# Patient Record
Sex: Female | Born: 1975 | Race: Black or African American | Hispanic: No | Marital: Single | Smoking: Never smoker
Health system: Southern US, Community
[De-identification: ages and names within clinical notes are randomized; demographics above are authoritative.]

## PROBLEM LIST (undated history)

## (undated) DIAGNOSIS — R7989 Other specified abnormal findings of blood chemistry: Secondary | ICD-10-CM

## (undated) DIAGNOSIS — F259 Schizoaffective disorder, unspecified: Secondary | ICD-10-CM

## (undated) DIAGNOSIS — I10 Essential (primary) hypertension: Secondary | ICD-10-CM

## (undated) DIAGNOSIS — R002 Palpitations: Secondary | ICD-10-CM

## (undated) DIAGNOSIS — E663 Overweight: Secondary | ICD-10-CM

## (undated) DIAGNOSIS — D131 Benign neoplasm of stomach: Secondary | ICD-10-CM

## (undated) DIAGNOSIS — F419 Anxiety disorder, unspecified: Secondary | ICD-10-CM

## (undated) DIAGNOSIS — K602 Anal fissure, unspecified: Secondary | ICD-10-CM

## (undated) DIAGNOSIS — L739 Follicular disorder, unspecified: Secondary | ICD-10-CM

## (undated) DIAGNOSIS — K5900 Constipation, unspecified: Secondary | ICD-10-CM

## (undated) DIAGNOSIS — K219 Gastro-esophageal reflux disease without esophagitis: Secondary | ICD-10-CM

## (undated) DIAGNOSIS — R000 Tachycardia, unspecified: Secondary | ICD-10-CM

## (undated) DIAGNOSIS — R7303 Prediabetes: Secondary | ICD-10-CM

## (undated) DIAGNOSIS — K649 Unspecified hemorrhoids: Secondary | ICD-10-CM

## (undated) HISTORY — PX: BREAST LUMPECTOMY: SHX2

## (undated) HISTORY — DX: Constipation, unspecified: K59.00

## (undated) HISTORY — PX: WISDOM TOOTH EXTRACTION: SHX21

## (undated) HISTORY — DX: Prediabetes: R73.03

## (undated) HISTORY — DX: Gastro-esophageal reflux disease without esophagitis: K21.9

## (undated) HISTORY — DX: Overweight: E66.3

## (undated) HISTORY — DX: Other specified abnormal findings of blood chemistry: R79.89

## (undated) HISTORY — DX: Unspecified hemorrhoids: K64.9

## (undated) HISTORY — DX: Follicular disorder, unspecified: L73.9

## (undated) HISTORY — DX: Benign neoplasm of stomach: D13.1

## (undated) HISTORY — DX: Essential (primary) hypertension: I10

## (undated) HISTORY — DX: Palpitations: R00.2

## (undated) HISTORY — DX: Anal fissure, unspecified: K60.2

## (undated) HISTORY — DX: Tachycardia, unspecified: R00.0

## (undated) HISTORY — DX: Schizoaffective disorder, unspecified: F25.9

---

## 2011-03-30 HISTORY — PX: OTHER SURGICAL HISTORY: SHX169

## 2016-11-01 ENCOUNTER — Encounter: Payer: Self-pay | Admitting: Internal Medicine

## 2016-12-22 ENCOUNTER — Encounter: Payer: Self-pay | Admitting: Nurse Practitioner

## 2016-12-22 ENCOUNTER — Ambulatory Visit (INDEPENDENT_AMBULATORY_CARE_PROVIDER_SITE_OTHER): Payer: Medicaid Other | Admitting: Nurse Practitioner

## 2016-12-22 DIAGNOSIS — K5900 Constipation, unspecified: Secondary | ICD-10-CM

## 2016-12-22 DIAGNOSIS — K219 Gastro-esophageal reflux disease without esophagitis: Secondary | ICD-10-CM | POA: Diagnosis not present

## 2016-12-22 NOTE — Assessment & Plan Note (Signed)
The patient describes constipation with 1-2 bowel movements a week and straining. Sometimes hard stools, sometimes runny stools. Possible constipation with overflow diarrhea. At this point I will start with over-the-counter medications and have her try Colace 100 mg daily. She can use MiraLAX powder 1-2 times a day as needed for no bowel movement and 2 days. Return for follow-up in 6 weeks.

## 2016-12-22 NOTE — Assessment & Plan Note (Addendum)
Significantly worsening reflux in the past 2 months. Has tried pantoprazole which caused diarrhea. Omeprazole caused cramps. Has tried ranitidine without luck, tried Tums without success. At this point I'll trial her on Dexon 60 mg once a day. I will provide her with samples to last a couple weeks and request a progress report in 1-2 weeks. Lives in 6 weeks.  Given her history of benign stomach tumor and sudden worsening of symptoms, we may need to proceed with upper endoscopy pending her results with Dexilant. I feel it is prudent to try her on a successful PPI first. There is always the possibility of H. pylori.

## 2016-12-22 NOTE — Progress Notes (Addendum)
Primary Care Physician:  Tollie Pizza, MD Primary Gastroenterologist:  Dr. Gala Romney  Chief Complaint  Patient presents with  . Gastroesophageal Reflux    HPI:   Gabrielle Scott is a 41 y.o. female who presents On referral from primary care for long-standing GERD poorly controlled with over-the-counter medications. PCP notes reviewed, last saw primary care 10/22/2016 for GERD and other symptoms. Patient noted also have hemorrhoids, still having some bright red blood in her stools, does not feel overtly obstipated. Stated at that time that she felt reflux has been bad, worse at night, Tums and ranitidine did not help, Protonix gave her diarrhea. Noted chronic history of GERD, history of gastric antrum mass on upper endoscopy completed by [facility]. Anusol cream was sent to the pharmacy for hemorrhoids.  [facility] gastroenterology records reviewed. The patient had a colonoscopy 07/27/2011 with a few ulcers in the rectum, 2 benign-appearing sessile polyps status post removal. 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 EGD completed also 2007 found a 1.5 cm umbilicated nodule in the gastric antrum status post biopsy. Surgical pathology does not favor gastrointestinal stromal tumor differentials include hyperplastic polyp, adenomatous polyp, inflammatory myelofibrosis last tumor, inflammatory fibroid polyp, plexiform angiomyxoma related myofibrosis blastic tumor. Pathology was sent to urination [place] for outside expert opinion.  Results of specialist consultation include These results essentially exclude a number ofpossibilities including a gastrointestinal stromal tumor, a Schwann cell orneural proliferation, a perineuroma, and an inflammatory fibroid polyp.Theproliferation produces some collagen as highlighted on the trichrome stain Iperformed and the cells have some weak staining for muscle specific actin, suggesting that they could be myofibroblasts.I am  left without a specific namefor this process, although I think it is benign.I also shared this case withmy colleagues, Drs. Sylvester Harder and Joyce Gross, as well as Dr. 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 Appears a stomach polypectomy was performed and inked margin appears negative for lesional tissue.  The patient had a follow-up appointment 10/05/2016 at [facility] gastroenterology but she did not show for her appointment.  Today she states she's doing ok overall. She began having worsening reflux/GERD symptoms about 2 months ago. TUMS and Zantac no longer effective. Stopped taking Protonix due to diarrhea ADE. Not on any other medications for reflux other than TUMS and Zantac. Denies N/V. Has hematochezia about once a week, on the paper and in the toilet. Denies melena, fever, chills, unintentional weight loss. Has occasional constipation for years, not currently on any medications for constipation. Has a bowel movement 2-3 times a week, occasional straining. Denies chest pain, dyspnea, dizziness, lightheadedness, syncope, near syncope. Denies any other upper or lower GI symptoms.  Past Medical History:  Diagnosis Date  . Anal fissure   . Benign neoplasm of stomach   . Constipation   . Elevated serum creatinine   . Folliculitis   . GERD (gastroesophageal reflux disease)   . Hemorrhoids   . Hypertension   . Overweight   . Palpitations   . Prediabetes   . Schizoaffective disorder ([place])   . Tachycardia   . White coat syndrome with diagnosis of hypertension     Past Surgical History:  Procedure Laterality Date  . BREAST LUMPECTOMY Left    Cannot remember date; non-cancerous  . gastric polypectomy  2013  .  WISDOM TOOTH EXTRACTION      Current Outpatient  Prescriptions  Medication Sig Dispense Refill  . Calcium Carbonate-Vitamin D (CALCIUM 600/VITAMIN D PO) Take 1 tablet by mouth 2 (two) times daily.    . cetirizine (ZYRTEC) 10 MG tablet Take 10 mg by mouth daily.    Marland Kitchen LORazepam (ATIVAN) 0.5 MG tablet Take 0.5 mg by mouth as needed for anxiety.    Marland Kitchen losartan-hydrochlorothiazide (HYZAAR) 50-12.5 MG tablet Take 1 tablet by mouth daily.    . MedroxyPROGESTERone Acetate (DEPO-PROVERA IM) Inject into the muscle every 3 (three) months.    . metoprolol tartrate (LOPRESSOR) 100 MG tablet Take 100 mg by mouth daily.    . QUEtiapine (SEROQUEL) 25 MG tablet Take 25 mg by mouth at bedtime.    . risperiDONE microspheres (RISPERDAL CONSTA) 50 MG injection Inject 50 mg into the muscle every 14 (fourteen) days.     No current facility-administered medications for this visit.     Allergies as of 12/22/2016  . (No Known Allergies)    Family History  Problem Relation Age of Onset  . Colon cancer Neg Hx   . Gastric cancer Neg Hx   . Esophageal cancer Neg Hx     Social History   Social History  . Marital status: Single    Spouse name: N/A  . Number of children: N/A  . Years of education: N/A   Occupational History  . Not on file.   Social History Main Topics  . Smoking status: Never Smoker  . Smokeless tobacco: Never Used  . Alcohol use No  . Drug use: No  . Sexual activity: Not on file   Other Topics Concern  . Not on file   Social History Narrative  . No narrative on file    Review of Systems: General: Negative for anorexia, weight loss, fever, chills, fatigue, weakness. ENT: Negative for hoarseness, difficulty swallowing. CV: Negative for chest pain, angina, palpitations, peripheral edema.  Respiratory: Negative for dyspnea at rest, cough, sputum, wheezing.  GI: See history of present illness. MS: Negative for joint pain, low back pain.  Derm: Negative for rash or itching.  Endo: Negative for unusual weight change.  Heme:  Negative for bruising or bleeding. Allergy: Negative for rash or hives.    Physical Exam: BP 122/85   Pulse 90   Temp (!) 97.2 F (36.2 C) (Oral)   Ht 5\' 6"  (1.676 m)   Wt 161 lb 3.2 oz (73.1 kg)   BMI 26.02 kg/m  General:   Alert and oriented. Pleasant and cooperative. Well-nourished and well-developed.  Head:  Normocephalic and atraumatic. Eyes:  Without icterus, sclera clear and conjunctiva pink.  Ears:  Normal auditory acuity. Cardiovascular:  S1, S2 present without murmurs appreciated. Extremities without clubbing or edema. Respiratory:  Clear to auscultation bilaterally. No wheezes, rales, or rhonchi. No distress.  Gastrointestinal:  +BS, soft, non-tender and non-distended. No HSM noted. No guarding or rebound. No masses appreciated.  Rectal:  Deferred  Musculoskalatal:  Symmetrical without gross deformities. Neurologic:  Alert and oriented x4;  grossly normal neurologically. Psych:  Alert and cooperative. Normal mood and affect. Somewhat slow to answer questions. Heme/Lymph/Immune: No excessive bruising noted.    12/29/2016 10:37 AM   Disclaimer: This note was dictated with voice recognition software. Similar sounding words can inadvertently be transcribed and may not be corrected upon review.

## 2016-12-22 NOTE — Patient Instructions (Signed)
1. I am giving you samples of Dexilant 60 mg. Take 1 pill, once a day, first thing in the morning before you eat breakfast. 2. For constipation, by a box of Colace stool softener. Take 1 pill, once a day, every day. 3. Use MiraLAX powder 1 or 2 times a day as needed for any time that she do not have a bowel movement in 2 days. 4. Return for follow-up in 6 weeks. 5. Call if you have any questions or concerns.

## 2016-12-28 ENCOUNTER — Telehealth: Payer: Self-pay | Admitting: Internal Medicine

## 2016-12-28 NOTE — Telephone Encounter (Signed)
Patient has used all of the samples of dexilant and needs a prescription called into [facility]

## 2016-12-28 NOTE — Telephone Encounter (Signed)
Routing to EG for rx

## 2016-12-29 ENCOUNTER — Telehealth: Payer: Self-pay | Admitting: Internal Medicine

## 2016-12-29 ENCOUNTER — Encounter: Payer: Self-pay | Admitting: Nurse Practitioner

## 2016-12-29 DIAGNOSIS — K219 Gastro-esophageal reflux disease without esophagitis: Secondary | ICD-10-CM

## 2016-12-29 NOTE — Telephone Encounter (Signed)
Patient needs dexilant sent to [facility] in [place]

## 2016-12-29 NOTE — Telephone Encounter (Signed)
Routing to the refill box. 

## 2016-12-30 ENCOUNTER — Telehealth: Payer: Self-pay | Admitting: Nurse Practitioner

## 2016-12-30 MED ORDER — DEXLANSOPRAZOLE 60 MG PO CPDR: 60.0000 mg | DELAYED_RELEASE_CAPSULE | Freq: Every day | ORAL | 3 refills | Status: DC

## 2016-12-30 NOTE — Telephone Encounter (Signed)
Sent!

## 2016-12-30 NOTE — Addendum Note (Signed)
Addended by: Gordy Levan, Carys Malina A on: 12/30/2016 09:49 AM   Modules accepted: Orders

## 2016-12-30 NOTE — Telephone Encounter (Signed)
Patient called inquiring about her prescription that needs to be sent to the pharmacy, she called yesterday and is upset.

## 2017-02-08 ENCOUNTER — Encounter: Payer: Self-pay | Admitting: Nurse Practitioner

## 2017-02-08 ENCOUNTER — Ambulatory Visit: Payer: Medicaid Other | Admitting: Nurse Practitioner

## 2017-02-08 VITALS — BP 132/88 | HR 77 | Temp 97.3°F | Ht 67.0 in | Wt 162.0 lb

## 2017-02-08 DIAGNOSIS — K921 Melena: Secondary | ICD-10-CM | POA: Diagnosis not present

## 2017-02-08 DIAGNOSIS — K219 Gastro-esophageal reflux disease without esophagitis: Secondary | ICD-10-CM

## 2017-02-08 DIAGNOSIS — K3189 Other diseases of stomach and duodenum: Secondary | ICD-10-CM | POA: Insufficient documentation

## 2017-02-08 DIAGNOSIS — K5900 Constipation, unspecified: Secondary | ICD-10-CM

## 2017-02-08 NOTE — Progress Notes (Signed)
Referring Provider: Tollie Pizza, MD Primary Care Physician:  Tollie Pizza, MD Primary GI:  Dr. Gala Romney  Chief Complaint  Patient presents with  . Gastroesophageal Reflux    HPI:   Gabrielle Scott is a 41 y.o. female who presents for follow-up on GERD.  The patient was last seen in our office 12/22/2016 for the same.  Chronic history of GERD noted.  Her last visit she was doing well overall.  She began having worsening GERD symptoms 2 months prior with Tums and Zantac no longer effective.  Stopped taking Protonix due to diarrhea adverse effect.  Occasional constipation for a number of years not currently taking any medications.  No other GI symptoms.  She has a complicated history of a, what was felt to be benign, gastric lesion.  Further information below.  Recommended Dexilant 60 mg once daily with samples provided and requested progress report.  Recommended Colace stool softener daily and MiraLAX powder as needed for constipation.  Follow-up in 6 weeks.  Today she states she's doing well overall. Dexilant is working "much better" than previous medications. Denies any breakthrough GERD symptoms. Denies dysphagia, N/V, fever, chills, unintentional weight loss. Takes Colace intermittently as well as MiraLAX and still has some constipation with bowel movement 2-3 times a week and noted straining, hard stools. Has abdominal pain when she needs to have a bowel movement; abdominal pain improves after a bowel movement. Admits persistent hematochezia with bowel movements on the paper and in the toilet. Denies chest pain, dyspnea, dizziness, lightheadedness, syncope, near syncope. Denies any other upper or lower GI symptoms.  Narrative of complex history: [facility] gastroenterology records reviewed. The patient had a colonoscopy 07/27/2011 with a few ulcers in the rectum, 2 benign-appearing sessile polyps status post removal. Rectum biopsy consistent with mucosal injury secondary to mucosal prolapse  syndrome. EGD completed also 2007 found a 1.5 cm umbilicated nodule in the gastric antrum status post biopsy. Surgical pathology does not favor gastrointestinal stromal tumor differentials include hyperplastic polyp, adenomatous polyp, inflammatory myelofibrosis last tumor, inflammatory fibroid polyp, plexiform angiomyxoma related myofibrosis blastic tumor. Pathology was sent to [facility] [place] for outside expert opinion.  Results of specialist consultation include These results essentially exclude a number ofpossibilities including a gastrointestinal stromal tumor, a Schwann cell orneural proliferation, a perineuroma, and an inflammatory fibroid polyp.Theproliferation produces some collagen as highlighted on the trichrome stain Iperformed and the cells have some weak staining for muscle specific actin, suggesting that they could be myofibroblasts.I am left without a specific namefor this process, although I think it is benign.I also shared this case withmy colleagues, Drs. Sylvester Harder and Joyce Gross, as well as Dr. 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 Appears a stomach polypectomy was performed and inked margin appears negative for lesional tissue.  The patient had a follow-up appointment 10/05/2016 at [facility] gastroenterology but she did not show for her appointment.  Past Medical History:  Diagnosis Date  . Anal fissure   . Benign neoplasm of stomach   . Constipation   . Elevated serum creatinine   . Folliculitis   . GERD (gastroesophageal reflux disease)   . Hemorrhoids   . Hypertension   . Overweight   . Palpitations   . Prediabetes   . Schizoaffective disorder ([place])   .  Tachycardia   . White coat syndrome with diagnosis of hypertension     Past  Surgical History:  Procedure Laterality Date  . BREAST LUMPECTOMY Left    Cannot remember date; non-cancerous  . gastric polypectomy  2013  . WISDOM TOOTH EXTRACTION      Current Outpatient Medications  Medication Sig Dispense Refill  . Calcium Carbonate-Vitamin D (CALCIUM 600/VITAMIN D PO) Take 1 tablet by mouth 2 (two) times daily.    . cetirizine (ZYRTEC) 10 MG tablet Take 10 mg by mouth daily.    Marland Kitchen dexlansoprazole (DEXILANT) 60 MG capsule Take 1 capsule (60 mg total) by mouth daily. 90 capsule 3  . LORazepam (ATIVAN) 0.5 MG tablet Take 0.5 mg by mouth as needed for anxiety.    Marland Kitchen losartan-hydrochlorothiazide (HYZAAR) 50-12.5 MG tablet Take 1 tablet by mouth daily.    . MedroxyPROGESTERone Acetate (DEPO-PROVERA IM) Inject into the muscle every 3 (three) months.    . metoprolol tartrate (LOPRESSOR) 100 MG tablet Take 100 mg by mouth daily.    . QUEtiapine (SEROQUEL) 25 MG tablet Take 25 mg by mouth at bedtime.    . risperiDONE microspheres (RISPERDAL CONSTA) 50 MG injection Inject 50 mg into the muscle every 14 (fourteen) days.     No current facility-administered medications for this visit.     Allergies as of 02/08/2017  . (No Known Allergies)    Family History  Problem Relation Age of Onset  . Colon cancer Neg Hx   . Gastric cancer Neg Hx   . Esophageal cancer Neg Hx     Social History   Socioeconomic History  . Marital status: Single    Spouse name: None  . Number of children: None  . Years of education: None  . Highest education level: None  Social Needs  . Financial resource strain: None  . Food insecurity - worry: None  . Food insecurity - inability: None  . Transportation needs - medical: None  . Transportation needs - non-medical: None  Occupational History  . None  Tobacco Use  . Smoking status: Never Smoker  . Smokeless tobacco: Never Used  Substance and Sexual Activity  . Alcohol use: No  . Drug use: No  . Sexual activity: None  Other Topics  Concern  . None  Social History Narrative  . None    Review of Systems: General: Negative for anorexia, weight loss, fever, chills, fatigue, weakness. ENT: Negative for hoarseness, difficulty swallowing. CV: Negative for chest pain, angina, palpitations, peripheral edema.  Respiratory: Negative for dyspnea at rest, cough, sputum, wheezing.  GI: See history of present illness. GU:  Negative for dysuria, hematuria, urinary incontinence, urinary frequency, nocturnal urination.  MS: Negative for joint pain, low back pain.  Psych: Negative for anxiety, depression, suicidal ideation, hallucinations.  Heme: Negative for bruising or bleeding. Allergy: Negative for rash or hives.   Physical Exam: BP 132/88   Pulse 77   Temp (!) 97.3 F (36.3 C) (Oral)   Ht 5\' 7"  (1.702 m)   Wt 162 lb (73.5 kg)   BMI 25.37 kg/m  General:   Alert and oriented. Pleasant and cooperative. Well-nourished and well-developed.  Eyes:  Without icterus, sclera clear and conjunctiva pink.  Ears:  Normal auditory acuity. Cardiovascular:  S1, S2 present without murmurs appreciated. Extremities without clubbing or edema. Respiratory:  Clear to auscultation bilaterally. No wheezes, rales, or rhonchi. No distress.  Gastrointestinal:  +BS, soft, non-tender and non-distended. No HSM noted. No guarding or rebound. No  masses appreciated.  Rectal:  Deferred  Musculoskalatal:  Symmetrical without gross deformities. Skin:  Intact without significant lesions or rashes. Neurologic:  Alert and oriented x4;  grossly normal neurologically. Psych:  Alert and cooperative. Pleasant mood, flat affect noted. Heme/Lymph/Immune: No excessive bruising noted.    02/08/2017 11:30 AM   Disclaimer: This note was dictated with voice recognition software. Similar sounding words can inadvertently be transcribed and may not be corrected upon review.

## 2017-02-08 NOTE — Patient Instructions (Signed)
1. Continue taking your Dexilant acid blocker. 2. I am giving you samples of Linzess 72 mcg pills.  Take this once a day, every day on an empty stomach to help with constipation. 3. On Linzess, you may have some diarrhea, but this typically goes away in about 3-5 days. 4. Call us if the diarrhea is not tolerable or if it does not go away within a week. 5. We will schedule your colonoscopy for you. 6. I highly recommend you call [facility] to follow-up related to that stomach tissue they removed.  They may want to do an endoscopy to evaluate after a certain amount of time. 7. Return for follow-up here in 3 months. 8. Call us if you have any questions or concerns.

## 2017-02-08 NOTE — Assessment & Plan Note (Signed)
Persistent constipation despite previous over-the-counter recommendations.  Abdominal pain associated with constipation, hard stools, straining.  Her pain resolved after bowel movement.  Likely irritable bowel syndrome versus chronic idiopathic constipation.  We will start her on Linzess 72 mcg once a day.  We will provide samples for 1-2 weeks and request a progress report in 1-2 weeks.  Colonoscopy will also help evaluate constipation.  Return for follow-up in 3 months.

## 2017-02-08 NOTE — Progress Notes (Signed)
cc'd to pcp 

## 2017-02-08 NOTE — Assessment & Plan Note (Signed)
The patient has a history of gastric nodule found on EGD with complex pathology requiring multiple pathologist to weigh in.  Overall, it was felt to be benign although the exact type of tissue cannot be identified.  She has not followed up with [facility] as a recommended.  I recommend she follow-up with [facility] for any further surveillance procedure she may need related to this.  We can manage her GERD and constipation locally.

## 2017-02-08 NOTE — Assessment & Plan Note (Signed)
GERD symptoms significantly improved on Dexilant.  Recommend she continue Dexilant for now.  Return for follow-up in 3 months.

## 2017-02-08 NOTE — Assessment & Plan Note (Signed)
Noted hematochezia with most bowel movements.  She does have constipation.  She also has a history of rectal ulcers and polyps.  Her last colonoscopy was about 5-1/2 years ago.  Given her persistent bleeding I recommended she undergo repeat colonoscopy for further evaluation.  Differentials include benign anorectal source, constipation, hemorrhoids.  However, cannot rule out more insidious pathology such as ulcers, polyps, colorectal cancer.  Return for follow-up in 3 months.  Proceed with TCS on propofol/MAC with Dr. Gala Romney in near future: the risks, benefits, and alternatives have been discussed with the patient in detail. The patient states understanding and desires to proceed.  The patient has a history of schizoaffective disorder.  She is currently on Ativan, Seroquel, Risperdal.  No other anticoagulants, anxiolytics, chronic pain medications, or antidepressants.  We will plan for the procedure on propofol/MAC to promote adequate sedation given polypharmacy and medical history.

## 2017-02-18 ENCOUNTER — Emergency Department (HOSPITAL_COMMUNITY)
Admission: EM | Admit: 2017-02-18 | Discharge: 2017-02-19 | Disposition: A | Discharge status: Home / Self Care | Payer: Medicaid Other | Attending: Emergency Medicine | Admitting: Emergency Medicine

## 2017-02-18 ENCOUNTER — Emergency Department (HOSPITAL_COMMUNITY): Payer: Medicaid Other

## 2017-02-18 ENCOUNTER — Other Ambulatory Visit: Payer: Self-pay

## 2017-02-18 ENCOUNTER — Encounter (HOSPITAL_COMMUNITY): Payer: Self-pay | Admitting: *Deleted

## 2017-02-18 DIAGNOSIS — K644 Residual hemorrhoidal skin tags: Secondary | ICD-10-CM | POA: Insufficient documentation

## 2017-02-18 DIAGNOSIS — K5900 Constipation, unspecified: Secondary | ICD-10-CM | POA: Diagnosis not present

## 2017-02-18 DIAGNOSIS — I10 Essential (primary) hypertension: Secondary | ICD-10-CM | POA: Diagnosis not present

## 2017-02-18 DIAGNOSIS — K649 Unspecified hemorrhoids: Secondary | ICD-10-CM

## 2017-02-18 DIAGNOSIS — R109 Unspecified abdominal pain: Secondary | ICD-10-CM | POA: Diagnosis present

## 2017-02-18 DIAGNOSIS — Z79899 Other long term (current) drug therapy: Secondary | ICD-10-CM | POA: Diagnosis not present

## 2017-02-18 LAB — CBC
HCT: 38.4 % (ref 36.0–46.0)
Hemoglobin: 12 g/dL (ref 12.0–15.0)
MCH: 26.5 pg (ref 26.0–34.0)
MCHC: 31.3 g/dL (ref 30.0–36.0)
MCV: 85 fL (ref 78.0–100.0)
Platelets: 236 10*3/uL (ref 150–400)
RBC: 4.52 MIL/uL (ref 3.87–5.11)
RDW: 13.6 % (ref 11.5–15.5)
WBC: 6.1 10*3/uL (ref 4.0–10.5)

## 2017-02-18 LAB — HCG, QUANTITATIVE, PREGNANCY: hCG, Beta Chain, Quant, S: <1 m[IU]/mL (ref <5)

## 2017-02-18 LAB — COMPREHENSIVE METABOLIC PANEL
ALT: 12 U/L — ABNORMAL LOW (ref 14–54)
AST: 14 U/L — ABNORMAL LOW (ref 15–41)
Albumin: 3.9 g/dL (ref 3.5–5.0)
Alkaline Phosphatase: 66 U/L (ref 38–126)
Anion gap: 10 (ref 5–15)
BUN: 9 mg/dL (ref 6–20)
CO2: 26 mmol/L (ref 22–32)
Calcium: 9.4 mg/dL (ref 8.9–10.3)
Chloride: 103 mmol/L (ref 101–111)
Creatinine, Ser: 0.86 mg/dL (ref 0.44–1.00)
GFR calc Af Amer: >60 mL/min (ref >60)
GFR calc non Af Amer: >60 mL/min (ref >60)
Glucose, Bld: 149 mg/dL — ABNORMAL HIGH (ref 65–99)
Potassium: 3 mmol/L — ABNORMAL LOW (ref 3.5–5.1)
Sodium: 139 mmol/L (ref 135–145)
Total Bilirubin: 0.3 mg/dL (ref 0.3–1.2)
Total Protein: 8.4 g/dL — ABNORMAL HIGH (ref 6.5–8.1)

## 2017-02-18 LAB — TYPE AND SCREEN
ABO/RH(D): O POS
Antibody Screen: NEGATIVE

## 2017-02-18 MED ORDER — MAGNESIUM HYDROXIDE 400 MG/5ML PO SUSP
960.0000 mL | Freq: Once | ORAL | Status: AC
  Administered 2017-02-18: 960 mL via RECTAL
  Filled 2017-02-18: qty 473

## 2017-02-18 NOTE — ED Triage Notes (Signed)
Pt c/o right lower abdominal pain and bright red rectal bleeding that started yesterday

## 2017-02-18 NOTE — ED Provider Notes (Addendum)
[facility] EMERGENCY DEPARTMENT Provider Note   CSN: [id number] Arrival date & time: 02/18/17  1926     History   Chief Complaint Chief Complaint  Patient presents with  . Abdominal Pain    HPI Gabrielle Scott is a 41 y.o. female.  Pt presents to the ED today with abdominal pain, constipation, and rectal bleeding.  The pt said sx started yesterday.  The pt said she's not had a bowel movement for "some days."  She is unable to quantify further.  The pt has taken otc miralax and colace without help.      Past Medical History:  Diagnosis Date  . Anal fissure   . Benign neoplasm of stomach   . Constipation   . Elevated serum creatinine   . Folliculitis   . GERD (gastroesophageal reflux disease)   . Hemorrhoids   . Hypertension   . Overweight   . Palpitations   . Prediabetes   . Schizoaffective disorder ([place])   . Tachycardia   . White coat syndrome with diagnosis of hypertension     Patient Active Problem List   Diagnosis Date Noted  . Hematochezia 02/08/2017  . Gastric nodule 02/08/2017  . GERD (gastroesophageal reflux disease) 12/22/2016  . Constipation 12/22/2016    Past Surgical History:  Procedure Laterality Date  . BREAST LUMPECTOMY Left    Cannot remember date; non-cancerous  . gastric polypectomy  2013  . WISDOM TOOTH EXTRACTION      OB History    No data available       Home Medications    Prior to Admission medications   Medication Sig Start Date End Date Taking? Authorizing Provider  Calcium Carbonate-Vitamin D (CALCIUM 600/VITAMIN D PO) Take 1 tablet by mouth 2 (two) times daily.   Yes [provider]  cetirizine (ZYRTEC) 10 MG tablet Take 10 mg by mouth daily.   Yes [provider]  docusate sodium (COLACE) 100 MG capsule 100 mg daily as needed for mild constipation.   Yes [provider]  LORazepam (ATIVAN) 0.5 MG tablet Take 0.5 mg by mouth as needed for anxiety.   Yes [provider]    losartan-hydrochlorothiazide (HYZAAR) 50-12.5 MG tablet Take 1 tablet by mouth daily.   Yes [provider]  MedroxyPROGESTERone Acetate (DEPO-PROVERA IM) Inject into the muscle every 3 (three) months.   Yes [provider]  metoprolol tartrate (LOPRESSOR) 100 MG tablet Take 100 mg by mouth daily.   Yes [provider]  mupirocin ointment (BACTROBAN) 2 % Place 1 application into the nose 2 (two) times daily.   Yes [provider]  pantoprazole (PROTONIX) 40 MG tablet Take 40 mg by mouth daily.   Yes [provider]  QUEtiapine (SEROQUEL) 25 MG tablet Take 25 mg by mouth at bedtime.   Yes [provider]  risperiDONE microspheres (RISPERDAL CONSTA) 50 MG injection Inject 50 mg into the muscle every 14 (fourteen) days.   Yes [provider]  dexlansoprazole (DEXILANT) 60 MG capsule Take 1 capsule (60 mg total) by mouth daily. 12/30/16   Carlis Stable, NP  hydrocortisone (PROCTOZONE-HC) 2.5 % rectal cream Place 1 application rectally 2 (two) times daily. 02/19/17   Isla Pence, MD  lactulose (CHRONULAC) 10 GM/15ML solution Take 15 mLs (10 g total) by mouth 3 (three) times daily. 02/19/17   Isla Pence, MD    Family History Family History  Problem Relation Age of Onset  . Colon cancer Neg Hx   .  Gastric cancer Neg Hx   . Esophageal cancer Neg Hx     Social History Social History   Tobacco Use  . Smoking status: Never Smoker  . Smokeless tobacco: Never Used  Substance Use Topics  . Alcohol use: No  . Drug use: No     Allergies   Patient has no known allergies.   Review of Systems Review of Systems  Gastrointestinal: Positive for abdominal pain, blood in stool and constipation.  All other systems reviewed and are negative.    Physical Exam Updated Vital Signs BP (!) 144/98 (BP Location: Left Arm)   Pulse 91   Temp 99.1 F (37.3 C) (Oral)   Resp 16   Ht 5\' 7"  (1.702 m)   Wt 73.5 kg (162 lb)   SpO2 98%    BMI 25.37 kg/m   Physical Exam  Constitutional: She is oriented to person, place, and time. She appears well-developed and well-nourished.  HENT:  Head: Normocephalic and atraumatic.  Mouth/Throat: Oropharynx is clear and moist.  Eyes: EOM are normal. Pupils are equal, round, and reactive to light.  Cardiovascular: Normal rate, regular rhythm, normal heart sounds and intact distal pulses.  Pulmonary/Chest: Effort normal and breath sounds normal.  Abdominal: Normal appearance and normal aorta. Bowel sounds are decreased.  Genitourinary: Rectal exam shows external hemorrhoid and guaiac positive stool.  Neurological: She is alert and oriented to person, place, and time.  Skin: Skin is warm. Capillary refill takes less than 2 seconds.  Psychiatric: She has a normal mood and affect. Her behavior is normal.  Nursing note and vitals reviewed.    ED Treatments / Results  Labs (all labs ordered are listed, but only abnormal results are displayed) Labs Reviewed  COMPREHENSIVE METABOLIC PANEL - Abnormal; Notable for the following components:      Result Value   Potassium 3.0 (*)    Glucose, Bld 149 (*)    Total Protein 8.4 (*)    AST 14 (*)    ALT 12 (*)    All other components within normal limits  CBC  HCG, QUANTITATIVE, PREGNANCY  POC OCCULT BLOOD, ED  TYPE AND SCREEN    EKG  EKG Interpretation None       Radiology Dg Abdomen Acute W/chest  Result Date: 02/18/2017 CLINICAL DATA:  Constipation. Lower abdominal pain and rectal bleeding. EXAM: DG ABDOMEN ACUTE W/ 1V CHEST COMPARISON:  None. FINDINGS: Low lung volumes. Mild cardiomegaly. Small bilateral pleural effusions, left greater than right. Mild vascular congestion with peribronchial cuffing. Moderate colonic stool burden with moderate stool in the ascending, transverse, and rectosigmoid colon. No small bowel dilatation to suggest obstruction. No free air. No radiopaque calculi. No acute osseous abnormalities. IMPRESSION:  1. Moderate colonic stool burden can be seen with constipation. No bowel obstruction. 2. Small pleural effusions with mild cardiomegaly and vascular congestion. Peribronchial cuffing may be pulmonary edema. Chest findings suggest CHF. Electronically Signed   By: Jeb Levering M.D.   On: 02/18/2017 23:24    Procedures Procedures (including critical care time)  Medications Ordered in ED Medications  sorbitol, milk of mag, mineral oil, glycerin (SMOG) enema (960 mLs Rectal Given 02/18/17 2358)     Initial Impression / Assessment and Plan / ED Course  I have reviewed the triage vital signs and the nursing notes.  Pertinent labs & imaging results that were available during my care of the patient were reviewed by me and considered in my medical decision making (see chart for details).  Pt given an enema in the ED.  She was able to have a bm.  She is feeling better.  She will be d/c home on lactulose because miralax is not helping.  Anusol for the hemorrhoids.  Final Clinical Impressions(s) / ED Diagnoses   Final diagnoses:  Constipation, unspecified constipation type  Hemorrhoids, unspecified hemorrhoid type    ED Discharge Orders        Ordered    lactulose (CHRONULAC) 10 GM/15ML solution  3 times daily     02/19/17 0014    hydrocortisone (PROCTOZONE-HC) 2.5 % rectal cream  2 times daily     02/19/17 0015       Isla Pence, MD 02/19/17 7116    Isla Pence, MD 02/19/17 0020

## 2017-02-19 MED ORDER — LACTULOSE 10 GM/15ML PO SOLN: 10.0000 g | Freq: Three times a day (TID) | ORAL | 0 refills | Status: DC

## 2017-02-19 MED ORDER — HYDROCORTISONE 2.5 % RE CREA: 1.0000 "application " | TOPICAL_CREAM | Freq: Two times a day (BID) | RECTAL | 0 refills | Status: DC

## 2017-03-30 ENCOUNTER — Other Ambulatory Visit: Payer: Self-pay | Admitting: Nurse Practitioner

## 2017-03-30 DIAGNOSIS — K219 Gastro-esophageal reflux disease without esophagitis: Secondary | ICD-10-CM

## 2017-05-11 ENCOUNTER — Encounter: Payer: Self-pay | Admitting: *Deleted

## 2017-05-11 ENCOUNTER — Other Ambulatory Visit: Payer: Self-pay | Admitting: *Deleted

## 2017-05-11 ENCOUNTER — Ambulatory Visit: Payer: Medicaid Other | Admitting: Nurse Practitioner

## 2017-05-11 ENCOUNTER — Telehealth: Payer: Self-pay | Admitting: *Deleted

## 2017-05-11 ENCOUNTER — Encounter: Payer: Self-pay | Admitting: Nurse Practitioner

## 2017-05-11 VITALS — BP 137/95 | HR 84 | Temp 98.5°F | Ht 66.0 in | Wt 161.6 lb

## 2017-05-11 DIAGNOSIS — K5900 Constipation, unspecified: Secondary | ICD-10-CM

## 2017-05-11 DIAGNOSIS — K219 Gastro-esophageal reflux disease without esophagitis: Secondary | ICD-10-CM | POA: Diagnosis not present

## 2017-05-11 DIAGNOSIS — K921 Melena: Secondary | ICD-10-CM

## 2017-05-11 MED ORDER — CLENPIQ 10-3.5-12 MG-GM -GM/160ML PO SOLN: 1.0000 | Freq: Once | ORAL | 0 refills | Status: AC

## 2017-05-11 NOTE — Assessment & Plan Note (Signed)
GERD symptoms currently well managed.  Continue PPI.  Follow-up in 3 months.

## 2017-05-11 NOTE — Progress Notes (Signed)
Referring Provider: Tollie Pizza, MD Primary Care Physician:  Tollie Pizza, MD Primary GI:  Dr. Gala Romney  Chief Complaint  Patient presents with  . Constipation    unable to take Linzess d/t diarrhea    HPI:   Gabrielle Scott is a 42 y.o. female who presents for follow-up on GERD and constipation.  The patient was last seen in our office 02/08/2017.  Chronic history of GERD.  Adverse effect to Protonix in the form of diarrhea.  Noted history of what was felt to be a benign gastric lesion (full narrative of this history available in OV note dated 02/08/2017).  At her last visit she was doing well overall on Dexilant which is working much better.  No GERD symptoms at that time.  Colace intermittent as well as MiraLAX with some persistent constipation and a bowel movement 2-3 times a week with noted straining and hard stools.  Abdominal pain that improves after a bowel movement.  No other GI symptoms.  Commended continue Dexilant, start Linzess 72 mcg request progress report in 1-2 weeks.  Recommended call [facility] to follow-up related to gastric polyp.  Follow-up in 3 months.  Progress report was not call as recommended.  View of care everywhere indicates the patient has not followed up with [facility] as previously recommended.  Today she states she's doing ok overall. GERD well controlled on Dexilant. Linzess was "too much" for her at 72 mcg dose and she had diarrhea so she stopped taking it. Taking Miralax as needed. Bowel movement 3 times a week. Noted hard stools with straining. Does have hematochezia of a significant amount, about every 2 weeks. Also with pain/irritation in her rectum. Has a chronic history of hemorrhoids, uses Proctozone which she feels helps. She also admits diarrhea after passing a hard stool. States she had a colonoscopy at [facility] "a really long time ago." Intermittent abdominal pain in her lower abdomen which improves after a bowel movement. Denies melena, fever, chills,  unintentional weight loss. Denies chest pain, dyspnea, dizziness, lightheadedness, syncope, near syncope. Denies any other upper or lower GI symptoms.  Past Medical History:  Diagnosis Date  . Anal fissure   . Benign neoplasm of stomach   . Constipation   . Elevated serum creatinine   . Folliculitis   . GERD (gastroesophageal reflux disease)   . Hemorrhoids   . Hypertension   . Overweight   . Palpitations   . Prediabetes   . Schizoaffective disorder ([place])   . Tachycardia   . White coat syndrome with diagnosis of hypertension     Past Surgical History:  Procedure Laterality Date  . BREAST LUMPECTOMY Left    Cannot remember date; non-cancerous  . gastric polypectomy  2013  . WISDOM TOOTH EXTRACTION      Current Outpatient Medications  Medication Sig Dispense Refill  . Calcium Carbonate-Vitamin D (CALCIUM 600/VITAMIN D PO) Take 1 tablet by mouth 2 (two) times daily.    . cetirizine (ZYRTEC) 10 MG tablet Take 10 mg by mouth daily.    Marland Kitchen DEXILANT 60 MG capsule TAKE (1) CAPSULE BY MOUTH ONCE DAILY. 90 capsule 3  . docusate sodium (COLACE) 100 MG capsule 100 mg daily as needed for mild constipation.    . hydrocortisone (PROCTOZONE-HC) 2.5 % rectal cream Place 1 application rectally 2 (two) times daily. 30 g 0  . LORazepam (ATIVAN) 0.5 MG tablet Take 0.5 mg by mouth as needed for anxiety.    Marland Kitchen losartan-hydrochlorothiazide (HYZAAR) 50-12.5 MG  tablet Take 1 tablet by mouth daily.    . MedroxyPROGESTERone Acetate (DEPO-PROVERA IM) Inject into the muscle every 3 (three) months.    . metoprolol tartrate (LOPRESSOR) 100 MG tablet Take 100 mg by mouth daily.    . mupirocin ointment (BACTROBAN) 2 % Place 1 application into the nose 2 (two) times daily.    . pantoprazole (PROTONIX) 40 MG tablet Take 40 mg by mouth daily.    . QUEtiapine (SEROQUEL) 25 MG tablet Take 25 mg by mouth at bedtime.    . risperiDONE microspheres (RISPERDAL CONSTA) 50 MG injection Inject 50 mg into the muscle every 14  (fourteen) days.     No current facility-administered medications for this visit.     Allergies as of 05/11/2017  . (No Known Allergies)    Family History  Problem Relation Age of Onset  . Colon cancer Neg Hx   . Gastric cancer Neg Hx   . Esophageal cancer Neg Hx     Social History   Socioeconomic History  . Marital status: Single    Spouse name: None  . Number of children: None  . Years of education: None  . Highest education level: None  Social Needs  . Financial resource strain: None  . Food insecurity - worry: None  . Food insecurity - inability: None  . Transportation needs - medical: None  . Transportation needs - non-medical: None  Occupational History  . None  Tobacco Use  . Smoking status: Never Smoker  . Smokeless tobacco: Never Used  Substance and Sexual Activity  . Alcohol use: No  . Drug use: No  . Sexual activity: None  Other Topics Concern  . None  Social History Narrative  . None    Review of Systems: General: Negative for anorexia, weight loss, fever, chills, fatigue, weakness. ENT: Negative for hoarseness, difficulty swallowing. CV: Negative for chest pain, angina, palpitations, peripheral edema.  Respiratory: Negative for dyspnea at rest, cough, sputum, wheezing.  GI: See history of present illness. Endo: Negative for unusual weight change.  Heme: Negative for bruising or bleeding.   Physical Exam: BP (!) 137/95   Pulse 84   Temp 98.5 F (36.9 C) (Oral)   Ht 5\' 6"  (1.676 m)   Wt 161 lb 9.6 oz (73.3 kg)   BMI 26.08 kg/m  General:   Alert and oriented. Pleasant and cooperative. Well-nourished and well-developed.  Eyes:  Without icterus, sclera clear and conjunctiva pink.  Ears:  Normal auditory acuity. Cardiovascular:  S1, S2 present without murmurs appreciated. Extremities without clubbing or edema. Respiratory:  Clear to auscultation bilaterally. No wheezes, rales, or rhonchi. No distress.  Gastrointestinal:  +BS, soft,  non-tender and non-distended. No HSM noted. No guarding or rebound. No masses appreciated.  Rectal:  Deferred  Musculoskalatal:  Symmetrical without gross deformities. Neurologic:  Alert and oriented x4;  grossly normal neurologically. Psych:  Alert and cooperative. Normal mood and affect. Heme/Lymph/Immune: No excessive bruising noted.    05/11/2017 2:32 PM   Disclaimer: This note was dictated with voice recognition software. Similar sounding words can inadvertently be transcribed and may not be corrected upon review.

## 2017-05-11 NOTE — Assessment & Plan Note (Signed)
Patient on Colace and MiraLAX.  She has a bowel movement 2-3 times a week and stools are hard/require straining.  After a hard bowel movement she will have subsequent diarrhea.  Linzess 72 mcg is too much and caused significant diarrhea for her.  At this point I will have her hold MiraLAX and Colace, start Amitiza at low dose 8 mcg twice daily.  Recommend she take this on a full stomach.  We will provide samples for 1-2 weeks and request a progress report in 1 week.  Return for follow-up in 3 months.

## 2017-05-11 NOTE — Assessment & Plan Note (Signed)
Patient continues to have persistent hematochezia with most bowel movements.  She states she had a colonoscopy many years ago at [facility].  She was previously recommended to have a colonoscopy at her last visit but this does not appear to have happened.  I am not sure why she was not scheduled.  I again recommend scheduling colonoscopy to further evaluate.  Proceed with TCS on propofol/MAC with Dr. Gala Romney in near future: the risks, benefits, and alternatives have been discussed with the patient in detail. The patient states understanding and desires to proceed.  Patient has schizoaffective disorder.  She is currently on Ativan, Seroquel, risperidone.  No other anticoagulants, anxiolytics, chronic pain medications, or antidepressants.  We will plan for the procedure on propofol/MAC to promote adequate sedation.

## 2017-05-11 NOTE — Telephone Encounter (Signed)
Called pt, NA and VM not set up.  Pre-op scheduled for 06/03/17 at 9:00am. Letter mailed to pt.

## 2017-05-11 NOTE — Patient Instructions (Signed)
1. Stop taking MiraLAX and Colace for now. 2. I am giving you samples of Amitiza 8 mcg.  Take this twice a day.  Take this after you eat a full meal and have a full stomach. 3. Call us in 1 week and let us know if it is helping your constipation. 4. If you have any side effects from this medicine call us. 5. We will schedule your colonoscopy for you. 6. Further recommendations will be made after your colonoscopy. 7. Return for follow-up in 3 months. 8. Call us if you have any questions or concerns. 9. We still recommend you follow-up with [facility] related to your stomach issue from several years ago.

## 2017-05-11 NOTE — Telephone Encounter (Signed)
Patient called back and is aware of pre-op appt details 

## 2017-05-12 NOTE — Progress Notes (Signed)
CC'D TO PCP °

## 2017-05-23 ENCOUNTER — Telehealth: Payer: Self-pay | Admitting: Internal Medicine

## 2017-05-23 DIAGNOSIS — K5900 Constipation, unspecified: Secondary | ICD-10-CM

## 2017-05-23 NOTE — Telephone Encounter (Signed)
Patient called and stated that the samples eric gave her are working and would like a prescription sent to her pharmacy

## 2017-05-23 NOTE — Telephone Encounter (Signed)
Pt was seen 05/11/17 and samples of Amitiza 15mcg bid were given.

## 2017-05-24 ENCOUNTER — Telehealth: Payer: Self-pay | Admitting: Internal Medicine

## 2017-05-24 NOTE — Telephone Encounter (Signed)
Spoke with pt. Message was sent to [facility] refill.

## 2017-05-24 NOTE — Telephone Encounter (Signed)
Routing to [facility] refill 

## 2017-05-24 NOTE — Telephone Encounter (Signed)
PATIENT CALLED AND STATED THAT THE PHARMACY HAS NOT RECEIVED THE PRESCRIPTION YET, PLEASE ADVISE

## 2017-05-24 NOTE — Telephone Encounter (Signed)
Patient has called again asking if her prescription (doesn't know the name) has been called to her pharmacy yet. I told her it was in the refill box and all the providers are seeing patients at the moment and it may be later today before they get to it. She said she was tired calling her pharmacy to check on it and she will call us back in the morning.

## 2017-05-26 MED ORDER — LUBIPROSTONE 8 MCG PO CAPS: 8.0000 ug | ORAL_CAPSULE | Freq: Two times a day (BID) | ORAL | 3 refills | Status: DC

## 2017-05-26 NOTE — Addendum Note (Signed)
Addended by: Gordy Levan, Chele Cornell A on: 05/26/2017 04:03 PM   Modules accepted: Orders

## 2017-05-26 NOTE — Telephone Encounter (Signed)
Noted. Tried calling pt. Number has been changed.

## 2017-05-26 NOTE — Telephone Encounter (Signed)
Sent!

## 2017-06-01 NOTE — Patient Instructions (Signed)
Gabrielle Scott  06/01/2017     @PREFPERIOPPHARMACY @   Your procedure is scheduled on  06/09/2017   Report to [facility] at  1015  A.M.  Call this number if you have problems the morning of surgery:  [phone]   Remember:  Do not eat food or drink liquids after midnight.  Take these medicines the morning of surgery with A SIP OF WATER  Zyrtec, dexilant, ativan, losartan, metoprolol.   Do not wear jewelry, make-up or nail polish.  Do not wear lotions, powders, or perfumes, or deodorant.  Do not shave 48 hours prior to surgery.  Men may shave face and neck.  Do not bring valuables to the hospital.  [facility] is not responsible for any belongings or valuables.  Contacts, dentures or bridgework may not be worn into surgery.  Leave your suitcase in the car.  After surgery it may be brought to your room.  For patients admitted to the hospital, discharge time will be determined by your treatment team.  Patients discharged the day of surgery will not be allowed to drive home.   Name and phone number of your driver:   family Special instructions:  Follow the diet and prep instructions given to you by Dr Roseanne Kaufman office.  Please read over the following fact sheets that you were given. Anesthesia Post-op Instructions and Care and Recovery After Surgery       Colonoscopy, Adult A colonoscopy is an exam to look at the large intestine. It is done to check for problems, such as:  Lumps (tumors).  Growths (polyps).  Swelling (inflammation).  Bleeding.  What happens before the procedure? Eating and drinking Follow instructions from your doctor about eating and drinking. These instructions may include:  A few days before the procedure - follow a low-fiber diet. ? Avoid nuts. ? Avoid seeds. ? Avoid dried fruit. ? Avoid raw fruits. ? Avoid vegetables.  1-3 days before the procedure - follow a clear liquid diet. Avoid liquids that have red or purple dye.  Drink only clear liquids, such as: ? Clear broth or bouillon. ? Black coffee or tea. ? Clear juice. ? Clear soft drinks or sports drinks. ? Gelatin dessert. ? Popsicles.  On the day of the procedure - do not eat or drink anything during the 2 hours before the procedure.  Bowel prep If you were prescribed an oral bowel prep:  Take it as told by your doctor. Starting the day before your procedure, you will need to drink a lot of liquid. The liquid will cause you to poop (have bowel movements) until your poop is almost clear or light green.  If your skin or butt gets irritated from diarrhea, you may: ? Wipe the area with wipes that have medicine in them, such as adult wet wipes with aloe and vitamin E. ? Put something on your skin that soothes the area, such as petroleum jelly.  If you throw up (vomit) while drinking the bowel prep, take a break for up to 60 minutes. Then begin the bowel prep again. If you keep throwing up and you cannot take the bowel prep without throwing up, call your doctor.  General instructions  Ask your doctor about changing or stopping your normal medicines. This is important if you take diabetes medicines or blood thinners.  Plan to have someone take you home from the hospital or clinic. What happens during the procedure?  An IV  tube may be put into one of your veins.  You will be given medicine to help you relax (sedative).  To reduce your risk of infection: ? Your doctors will wash their hands. ? Your anal area will be washed with soap.  You will be asked to lie on your side with your knees bent.  Your doctor will get a long, thin, flexible tube ready. The tube will have a camera and a light on the end.  The tube will be put into your anus.  The tube will be gently put into your large intestine.  Air will be delivered into your large intestine to keep it open. You may feel some pressure or cramping.  The camera will be used to take photos.  A  small tissue sample may be removed from your body to be looked at under a microscope (biopsy). If any possible problems are found, the tissue will be sent to a lab for testing.  If small growths are found, your doctor may remove them and have them checked for cancer.  The tube that was put into your anus will be slowly removed. The procedure may vary among doctors and hospitals. What happens after the procedure?  Your doctor will check on you often until the medicines you were given have worn off.  Do not drive for 24 hours after the procedure.  You may have a small amount of blood in your poop.  You may pass gas.  You may have mild cramps or bloating in your belly (abdomen).  It is up to you to get the results of your procedure. Ask your doctor, or the department performing the procedure, when your results will be ready. This information is not intended to replace advice given to you by your health care provider. Make sure you discuss any questions you have with your health care provider. Document Released: 04/17/2010 Document Revised: 01/14/2016 Document Reviewed: 05/27/2015 Elsevier Interactive Patient Education  2017 Elsevier [employer]  Colonoscopy, Adult, Care After This sheet gives you information about how to care for yourself after your procedure. Your health care provider may also give you more specific instructions. If you have problems or questions, contact your health care provider. What can I expect after the procedure? After the procedure, it is common to have:  A small amount of blood in your stool for 24 hours after the procedure.  Some gas.  Mild abdominal cramping or bloating.  Follow these instructions at home: General instructions   For the first 24 hours after the procedure: ? Do not drive or use machinery. ? Do not sign important documents. ? Do not drink alcohol. ? Do your regular daily activities at a slower pace than normal. ? Eat soft, easy-to-digest  foods. ? Rest often.  Take over-the-counter or prescription medicines only as told by your health care provider.  It is up to you to get the results of your procedure. Ask your health care provider, or the department performing the procedure, when your results will be ready. Relieving cramping and bloating  Try walking around when you have cramps or feel bloated.  Apply heat to your abdomen as told by your health care provider. Use a heat source that your health care provider recommends, such as a moist heat pack or a heating pad. ? Place a towel between your skin and the heat source. ? Leave the heat on for 20-30 minutes. ? Remove the heat if your skin turns bright red. This is especially important  if you are unable to feel pain, heat, or cold. You may have a greater risk of getting burned. Eating and drinking  Drink enough fluid to keep your urine clear or pale yellow.  Resume your normal diet as instructed by your health care provider. Avoid heavy or fried foods that are hard to digest.  Avoid drinking alcohol for as long as instructed by your health care provider. Contact a health care provider if:  You have blood in your stool 2-3 days after the procedure. Get help right away if:  You have more than a small spotting of blood in your stool.  You pass large blood clots in your stool.  Your abdomen is swollen.  You have nausea or vomiting.  You have a fever.  You have increasing abdominal pain that is not relieved with medicine. This information is not intended to replace advice given to you by your health care provider. Make sure you discuss any questions you have with your health care provider. Document Released: 10/28/2003 Document Revised: 12/08/2015 Document Reviewed: 05/27/2015 Elsevier Interactive Patient Education  2018 [place] Anesthesia is a term that refers to techniques, procedures, and medicines that help a person stay safe  and comfortable during a medical procedure. Monitored anesthesia care, or sedation, is one type of anesthesia. Your anesthesia specialist may recommend sedation if you will be having a procedure that does not require you to be unconscious, such as:  Cataract surgery.  A dental procedure.  A biopsy.  A colonoscopy.  During the procedure, you may receive a medicine to help you relax (sedative). There are three levels of sedation:  Mild sedation. At this level, you may feel awake and relaxed. You will be able to follow directions.  Moderate sedation. At this level, you will be sleepy. You may not remember the procedure.  Deep sedation. At this level, you will be asleep. You will not remember the procedure.  The more medicine you are given, the deeper your level of sedation will be. Depending on how you respond to the procedure, the anesthesia specialist may change your level of sedation or the type of anesthesia to fit your needs. An anesthesia specialist will monitor you closely during the procedure. Let your health care provider know about:  Any allergies you have.  All medicines you are taking, including vitamins, herbs, eye drops, creams, and over-the-counter medicines.  Any use of steroids (by mouth or as a cream).  Any problems you or family members have had with sedatives and anesthetic medicines.  Any blood disorders you have.  Any surgeries you have had.  Any medical conditions you have, such as sleep apnea.  Whether you are pregnant or may be pregnant.  Any use of cigarettes, alcohol, or street drugs. What are the risks? Generally, this is a safe procedure. However, problems may occur, including:  Getting too much medicine (oversedation).  Nausea.  Allergic reaction to medicines.  Trouble breathing. If this happens, a breathing tube may be used to help with breathing. It will be removed when you are awake and breathing on your own.  Heart trouble.  Lung  trouble.  Before the procedure Staying hydrated Follow instructions from your health care provider about hydration, which may include:  Up to 2 hours before the procedure - you may continue to drink clear liquids, such as water, clear fruit juice, black coffee, and plain tea.  Eating and drinking restrictions Follow instructions from your health care provider about eating  and drinking, which may include:  8 hours before the procedure - stop eating heavy meals or foods such as meat, fried foods, or fatty foods.  6 hours before the procedure - stop eating light meals or foods, such as toast or cereal.  6 hours before the procedure - stop drinking milk or drinks that contain milk.  2 hours before the procedure - stop drinking clear liquids.  Medicines Ask your health care provider about:  Changing or stopping your regular medicines. This is especially important if you are taking diabetes medicines or blood thinners.  Taking medicines such as aspirin and ibuprofen. These medicines can thin your blood. Do not take these medicines before your procedure if your health care provider instructs you not to.  Tests and exams  You will have a physical exam.  You may have blood tests done to show: ? How well your kidneys and liver are working. ? How well your blood can clot.  General instructions  Plan to have someone take you home from the hospital or clinic.  If you will be going home right after the procedure, plan to have someone with you for 24 hours.  What happens during the procedure?  Your blood pressure, heart rate, breathing, level of pain and overall condition will be monitored.  An IV tube will be inserted into one of your veins.  Your anesthesia specialist will give you medicines as needed to keep you comfortable during the procedure. This may mean changing the level of sedation.  The procedure will be performed. After the procedure  Your blood pressure, heart rate,  breathing rate, and blood oxygen level will be monitored until the medicines you were given have worn off.  Do not drive for 24 hours if you received a sedative.  You may: ? Feel sleepy, clumsy, or nauseous. ? Feel forgetful about what happened after the procedure. ? Have a sore throat if you had a breathing tube during the procedure. ? Vomit. This information is not intended to replace advice given to you by your health care provider. Make sure you discuss any questions you have with your health care provider. Document Released: 12/09/2004 Document Revised: 08/22/2015 Document Reviewed: 07/06/2015 Elsevier Interactive Patient Education  2018 [place], Care After These instructions provide you with information about caring for yourself after your procedure. Your health care provider may also give you more specific instructions. Your treatment has been planned according to current medical practices, but problems sometimes occur. Call your health care provider if you have any problems or questions after your procedure. What can I expect after the procedure? After your procedure, it is common to:  Feel sleepy for several hours.  Feel clumsy and have poor balance for several hours.  Feel forgetful about what happened after the procedure.  Have poor judgment for several hours.  Feel nauseous or vomit.  Have a sore throat if you had a breathing tube during the procedure.  Follow these instructions at home: For at least 24 hours after the procedure:   Do not: ? Participate in activities in which you could fall or become injured. ? Drive. ? Use heavy machinery. ? Drink alcohol. ? Take sleeping pills or medicines that cause drowsiness. ? Make important decisions or sign legal documents. ? Take care of children on your own.  Rest. Eating and drinking  Follow the diet that is recommended by your health care provider.  If you vomit, drink water,  juice, or soup  when you can drink without vomiting.  Make sure you have little or no nausea before eating solid foods. General instructions  Have a responsible adult stay with you until you are awake and alert.  Take over-the-counter and prescription medicines only as told by your health care provider.  If you smoke, do not smoke without supervision.  Keep all follow-up visits as told by your health care provider. This is important. Contact a health care provider if:  You keep feeling nauseous or you keep vomiting.  You feel light-headed.  You develop a rash.  You have a fever. Get help right away if:  You have trouble breathing. This information is not intended to replace advice given to you by your health care provider. Make sure you discuss any questions you have with your health care provider. Document Released: 07/06/2015 Document Revised: 11/05/2015 Document Reviewed: 07/06/2015 Elsevier Interactive Patient Education  [employer].

## 2017-06-03 ENCOUNTER — Telehealth: Payer: Self-pay | Admitting: Nurse Practitioner

## 2017-06-03 ENCOUNTER — Encounter (HOSPITAL_COMMUNITY): Payer: Self-pay

## 2017-06-03 ENCOUNTER — Encounter (HOSPITAL_COMMUNITY)
Admission: RE | Admit: 2017-06-03 | Discharge: 2017-06-03 | Disposition: A | Discharge status: Home / Self Care | Payer: Medicaid Other | Source: Ambulatory Visit | Attending: Internal Medicine | Admitting: Internal Medicine

## 2017-06-03 ENCOUNTER — Other Ambulatory Visit: Payer: Self-pay

## 2017-06-03 DIAGNOSIS — E876 Hypokalemia: Secondary | ICD-10-CM

## 2017-06-03 DIAGNOSIS — Z01812 Encounter for preprocedural laboratory examination: Secondary | ICD-10-CM | POA: Diagnosis present

## 2017-06-03 DIAGNOSIS — Z01810 Encounter for preprocedural cardiovascular examination: Secondary | ICD-10-CM | POA: Insufficient documentation

## 2017-06-03 HISTORY — DX: Anxiety disorder, unspecified: F41.9

## 2017-06-03 LAB — BASIC METABOLIC PANEL
Anion gap: 11 (ref 5–15)
BUN: 8 mg/dL (ref 6–20)
CO2: 25 mmol/L (ref 22–32)
Calcium: 9.1 mg/dL (ref 8.9–10.3)
Chloride: 101 mmol/L (ref 101–111)
Creatinine, Ser: 0.85 mg/dL (ref 0.44–1.00)
GFR calc Af Amer: >60 mL/min (ref >60)
GFR calc non Af Amer: >60 mL/min (ref >60)
Glucose, Bld: 99 mg/dL (ref 65–99)
Potassium: 2.9 mmol/L — ABNORMAL LOW (ref 3.5–5.1)
Sodium: 137 mmol/L (ref 135–145)

## 2017-06-03 LAB — CBC WITH DIFFERENTIAL/PLATELET
Basophils Absolute: 0 10*3/uL (ref 0.0–0.1)
Basophils Relative: 1 %
Eosinophils Absolute: 0 10*3/uL (ref 0.0–0.7)
Eosinophils Relative: 1 %
HCT: 39.8 % (ref 36.0–46.0)
Hemoglobin: 12.4 g/dL (ref 12.0–15.0)
Lymphocytes Relative: 33 %
Lymphs Abs: 1.3 10*3/uL (ref 0.7–4.0)
MCH: 26.1 pg (ref 26.0–34.0)
MCHC: 31.2 g/dL (ref 30.0–36.0)
MCV: 83.6 fL (ref 78.0–100.0)
Monocytes Absolute: 0.3 10*3/uL (ref 0.1–1.0)
Monocytes Relative: 8 %
Neutro Abs: 2.3 10*3/uL (ref 1.7–7.7)
Neutrophils Relative %: 57 %
Platelets: 209 10*3/uL (ref 150–400)
RBC: 4.76 MIL/uL (ref 3.87–5.11)
RDW: 13 % (ref 11.5–15.5)
WBC: 3.9 10*3/uL — ABNORMAL LOW (ref 4.0–10.5)

## 2017-06-03 LAB — HCG, SERUM, QUALITATIVE: Preg, Serum: NEGATIVE

## 2017-06-03 MED ORDER — POTASSIUM CHLORIDE ER 20 MEQ PO TBCR
20.0000 meq | EXTENDED_RELEASE_TABLET | Freq: Two times a day (BID) | ORAL | 0 refills | Status: DC
Start: 2017-06-03 — End: 2019-07-05

## 2017-06-03 NOTE — Pre-Procedure Instructions (Signed)
Potassium of 2.9 called and routed to [facility]. He will give patient some supplemental potassium.

## 2017-06-03 NOTE — Telephone Encounter (Signed)
Received BMP results from short stay in Dr. Roseanne Kaufman absence. K+ 2.9. Kidney function normal. Will send in KCl 20 mEq bid x 5 days.  Called and notified patient who verbalized understanding.

## 2017-06-09 ENCOUNTER — Ambulatory Visit (HOSPITAL_COMMUNITY): Admit: 2017-06-09 | Payer: Medicaid Other | Admitting: Internal Medicine

## 2017-06-09 ENCOUNTER — Ambulatory Visit (HOSPITAL_COMMUNITY): Payer: Medicaid Other | Admitting: Certified Registered"

## 2017-06-09 ENCOUNTER — Encounter (HOSPITAL_COMMUNITY)
Admission: RE | Disposition: A | Discharge status: Home / Self Care | Payer: Self-pay | Source: Ambulatory Visit | Attending: Internal Medicine

## 2017-06-09 ENCOUNTER — Encounter (HOSPITAL_COMMUNITY): Payer: Self-pay

## 2017-06-09 ENCOUNTER — Ambulatory Visit (HOSPITAL_COMMUNITY)
Admission: RE | Admit: 2017-06-09 | Discharge: 2017-06-09 | Disposition: A | Discharge status: Home / Self Care | Payer: Medicaid Other | Source: Ambulatory Visit | Attending: Internal Medicine | Admitting: Internal Medicine

## 2017-06-09 DIAGNOSIS — K641 Second degree hemorrhoids: Secondary | ICD-10-CM | POA: Diagnosis not present

## 2017-06-09 DIAGNOSIS — K5900 Constipation, unspecified: Secondary | ICD-10-CM | POA: Diagnosis not present

## 2017-06-09 DIAGNOSIS — F259 Schizoaffective disorder, unspecified: Secondary | ICD-10-CM | POA: Diagnosis not present

## 2017-06-09 DIAGNOSIS — I10 Essential (primary) hypertension: Secondary | ICD-10-CM | POA: Diagnosis not present

## 2017-06-09 DIAGNOSIS — K219 Gastro-esophageal reflux disease without esophagitis: Secondary | ICD-10-CM

## 2017-06-09 DIAGNOSIS — R7303 Prediabetes: Secondary | ICD-10-CM | POA: Diagnosis not present

## 2017-06-09 DIAGNOSIS — K921 Melena: Secondary | ICD-10-CM | POA: Diagnosis present

## 2017-06-09 DIAGNOSIS — Z79899 Other long term (current) drug therapy: Secondary | ICD-10-CM | POA: Insufficient documentation

## 2017-06-09 DIAGNOSIS — F419 Anxiety disorder, unspecified: Secondary | ICD-10-CM | POA: Diagnosis not present

## 2017-06-09 HISTORY — PX: COLONOSCOPY WITH PROPOFOL: SHX5780

## 2017-06-09 LAB — POCT I-STAT 4, (NA,K, GLUC, HGB,HCT)
Glucose, Bld: 88 mg/dL (ref 65–99)
HCT: 41 % (ref 36.0–46.0)
Hemoglobin: 13.9 g/dL (ref 12.0–15.0)
Potassium: 3.3 mmol/L — ABNORMAL LOW (ref 3.5–5.1)
Sodium: 144 mmol/L (ref 135–145)

## 2017-06-09 SURGERY — COLONOSCOPY WITH PROPOFOL
Anesthesia: Monitor Anesthesia Care

## 2017-06-09 MED ORDER — FENTANYL CITRATE (PF) 100 MCG/2ML IJ SOLN
25.0000 ug | Freq: Once | INTRAMUSCULAR | Status: AC
  Administered 2017-06-09: 25 ug via INTRAVENOUS

## 2017-06-09 MED ORDER — MIDAZOLAM HCL 2 MG/2ML IJ SOLN
INTRAMUSCULAR | Status: AC
  Filled 2017-06-09: qty 2

## 2017-06-09 MED ORDER — PROPOFOL 500 MG/50ML IV EMUL
INTRAVENOUS | Status: DC | PRN
  Administered 2017-06-09: 100 ug/kg/min via INTRAVENOUS

## 2017-06-09 MED ORDER — PROPOFOL 10 MG/ML IV BOLUS
INTRAVENOUS | Status: AC
  Filled 2017-06-09: qty 20

## 2017-06-09 MED ORDER — CHLORHEXIDINE GLUCONATE CLOTH 2 % EX PADS: 6.0000 | MEDICATED_PAD | Freq: Once | CUTANEOUS | Status: DC

## 2017-06-09 MED ORDER — MIDAZOLAM HCL 2 MG/2ML IJ SOLN
1.0000 mg | INTRAMUSCULAR | Status: AC
  Administered 2017-06-09: 2 mg via INTRAVENOUS

## 2017-06-09 MED ORDER — LACTATED RINGERS IV SOLN
INTRAVENOUS | Status: DC
  Administered 2017-06-09: 12:00:00 via INTRAVENOUS

## 2017-06-09 MED ORDER — FENTANYL CITRATE (PF) 100 MCG/2ML IJ SOLN
INTRAMUSCULAR | Status: AC
  Filled 2017-06-09: qty 2

## 2017-06-09 MED ORDER — PROPOFOL 10 MG/ML IV BOLUS
INTRAVENOUS | Status: DC | PRN
  Administered 2017-06-09: 50 mg via INTRAVENOUS
  Administered 2017-06-09: 20 mg via INTRAVENOUS

## 2017-06-09 NOTE — Anesthesia Postprocedure Evaluation (Signed)
Anesthesia Post Note  Patient: Jilene Spohr  Procedure(s) Performed: COLONOSCOPY WITH PROPOFOL (N/A )  Patient location during evaluation: PACU Anesthesia Type: MAC Level of consciousness: awake and alert, oriented and patient cooperative Pain management: satisfactory to patient Vital Signs Assessment: post-procedure vital signs reviewed and stable Respiratory status: spontaneous breathing, nonlabored ventilation and respiratory function stable Cardiovascular status: blood pressure returned to baseline and stable Postop Assessment: no headache, adequate PO intake and no apparent nausea or vomiting Anesthetic complications: no     Last Vitals:  Vitals:   06/09/17 1250 06/09/17 1316  BP: (!) 133/91 (!) 131/93  Pulse:  (!) 104  Resp: (!) 9 20  Temp:  36.9 C  SpO2: 100% 100%    Last Pain:  Vitals:   06/09/17 1036  TempSrc: Oral                 Jaice Lague E Keitra Carusone

## 2017-06-09 NOTE — Op Note (Signed)
[facility] Patient Name: Gabrielle Scott Procedure Date: 06/09/2017 12:51 PM MRN: [id number] Date of Birth: May 17, 1975 Attending MD: Norvel Richards , MD CSN: [id number] Age: 42 Admit Type: Outpatient Procedure:                Colonoscopy Indications:              Hematochezia Providers:                Norvel Richards, MD, Jeanann Lewandowsky. Sharon Seller, RN,                            Randa Spike, Technician Referring MD:              Medicines:                Propofol per Anesthesia Complications:            No immediate complications. Estimated Blood Loss:     Estimated blood loss: none. Procedure:                Pre-Anesthesia Assessment:                           - Prior to the procedure, a History and Physical                            was performed, and patient medications and                            allergies were reviewed. The patient's tolerance of                            previous anesthesia was also reviewed. The risks                            and benefits of the procedure and the sedation                            options and risks were discussed with the patient.                            All questions were answered, and informed consent                            was obtained. Prior Anticoagulants: The patient has                            taken no previous anticoagulant or antiplatelet                            agents. ASA Grade Assessment: II - A patient with                            mild systemic disease. After reviewing the risks  and benefits, the patient was deemed in                            satisfactory condition to undergo the procedure.                           After obtaining informed consent, the colonoscope                            was passed under direct vision. Throughout the                            procedure, the patient's blood pressure, pulse, and                            oxygen saturations were  monitored continuously. The                            EC-[id number] ([id number]) scope was introduced through                            the and advanced to the the cecum, identified by                            appendiceal orifice and ileocecal valve. The                            ileocecal valve, appendiceal orifice, and rectum                            were photographed. The quality of the bowel                            preparation was adequate. Scope In: 1:00:24 PM Scope Out: 1:11:02 PM Scope Withdrawal Time: 0 hours 5 minutes 56 seconds  Total Procedure Duration: 0 hours 10 minutes 38 seconds  Findings:      The perianal and digital rectal examinations were normal.      The colon (entire examined portion) appeared normal.      Non-bleeding internal hemorrhoids were found during retroflexion. The       hemorrhoids were Grade II (internal hemorrhoids that prolapse but reduce       spontaneously).      The exam was otherwise without abnormality on direct and retroflexion       views. Impression:               - The entire examined colon is normal.                           - Internal hemorrhoids - likely source of                            hematochezia                           - The examination was otherwise normal  on direct                            and retroflexion views.                           - No specimens collected. Moderate Sedation:      Moderate (conscious) sedation was personally administered by an       anesthesia professional. The following parameters were monitored: oxygen       saturation, heart rate, blood pressure, respiratory rate, EKG, adequacy       of pulmonary ventilation, and response to care. Total physician       intraservice time was 15 minutes. Recommendation:           - Patient has a contact number available for                            emergencies. The signs and symptoms of potential                            delayed complications were discussed  with the                            patient. Return to normal activities tomorrow.                            Written discharge instructions were provided to the                            patient.                           - Resume previous diet.                           - Continue present medications. Course of [place]                            suppositories.                           - Repeat colonoscopy in 10 years for screening                            purposes.                           - Return to GI clinic in 6 weeks. Procedure Code(s):        --- Professional ---                           [phone], Colonoscopy, flexible; diagnostic, including                            collection of specimen(s) by brushing or washing,  when performed (separate procedure) Diagnosis Code(s):        --- Professional ---                           K64.1, Second degree hemorrhoids                           K92.1, Melena (includes Hematochezia) CPT copyright 2016 American Medical Association. All rights reserved. The codes documented in this report are preliminary and upon coder review may  be revised to meet current compliance requirements. Cristopher Estimable. Michalla Ringer, MD Norvel Richards, MD 06/09/2017 1:23:16 PM This report has been signed electronically. Number of Addenda: 0

## 2017-06-09 NOTE — Anesthesia Preprocedure Evaluation (Signed)
Anesthesia Evaluation  Patient identified by MRN, date of birth, ID band Patient awake    Reviewed: Allergy & Precautions, NPO status , Patient's Chart, lab work & pertinent test results, reviewed documented beta blocker date and time   Airway Mallampati: II  TM Distance: >3 FB Neck ROM: Full    Dental  (+) Teeth Intact   Pulmonary neg pulmonary ROS,    breath sounds clear to auscultation       Cardiovascular hypertension, Pt. on home beta blockers and Pt. on medications  Rhythm:Regular Rate:Normal     Neuro/Psych PSYCHIATRIC DISORDERS Anxiety Schizophrenia    GI/Hepatic GERD  ,  Endo/Other  diabetes  Renal/GU      Musculoskeletal   Abdominal   Peds  Hematology   Anesthesia Other Findings   Reproductive/Obstetrics                             Anesthesia Physical Anesthesia Plan  ASA: III  Anesthesia Plan: MAC   Post-op Pain Management:    Induction: Intravenous  PONV Risk Score and Plan:   Airway Management Planned: Simple Face Mask  Additional Equipment:   Intra-op Plan:   Post-operative Plan:   Informed Consent: I have reviewed the patients History and Physical, chart, labs and discussed the procedure including the risks, benefits and alternatives for the proposed anesthesia with the patient or authorized representative who has indicated his/her understanding and acceptance.     Plan Discussed with:   Anesthesia Plan Comments:         Anesthesia Quick Evaluation

## 2017-06-09 NOTE — Transfer of Care (Signed)
Immediate Anesthesia Transfer of Care Note  Patient: Gabrielle Scott  Procedure(s) Performed: COLONOSCOPY WITH PROPOFOL (N/A )  Patient Location: PACU  Anesthesia Type:MAC  Level of Consciousness: awake, alert , oriented and patient cooperative  Airway & Oxygen Therapy: Patient Spontanous Breathing  Post-op Assessment: Report given to RN, Post -op Vital signs reviewed and stable and Patient moving all extremities X 4  Post vital signs: Reviewed and stable  Last Vitals:  Vitals:   06/09/17 1245 06/09/17 1250  BP: 136/82 (!) 133/91  Pulse:    Resp: (!) 21 (!) 9  Temp:    SpO2: 100% 100%    Last Pain:  Vitals:   06/09/17 1036  TempSrc: Oral         Complications: No apparent anesthesia complications

## 2017-06-09 NOTE — Discharge Instructions (Signed)
Colonoscopy Discharge Instructions  Read the instructions outlined below and refer to this sheet in the next few weeks. These discharge instructions provide you with general information on caring for yourself after you leave the hospital. Your doctor may also give you specific instructions. While your treatment has been planned according to the most current medical practices available, unavoidable complications occasionally occur. If you have any problems or questions after discharge, call Dr. Gala Scott at [phone]. ACTIVITY  You may resume your regular activity, but move at a slower pace for the next 24 hours.   Take frequent rest periods for the next 24 hours.   Walking will help get rid of the air and reduce the bloated feeling in your belly (abdomen).   No driving for 24 hours (because of the medicine (anesthesia) used during the test).    Do not sign any important legal documents or operate any machinery for 24 hours (because of the anesthesia used during the test).  NUTRITION  Drink plenty of fluids.   You may resume your normal diet as instructed by your doctor.   Begin with a light meal and progress to your normal diet. Heavy or fried foods are harder to digest and may make you feel sick to your stomach (nauseated).   Avoid alcoholic beverages for 24 hours or as instructed.  MEDICATIONS  You may resume your normal medications unless your doctor tells you otherwise.  WHAT YOU CAN EXPECT TODAY  Some feelings of bloating in the abdomen.   Passage of more gas than usual.   Spotting of blood in your stool or on the toilet paper.  IF YOU HAD POLYPS REMOVED DURING THE COLONOSCOPY:  No aspirin products for 7 days or as instructed.   No alcohol for 7 days or as instructed.   Eat a soft diet for the next 24 hours.  FINDING OUT THE RESULTS OF YOUR TEST Not all test results are available during your visit. If your test results are not back during the visit, make an appointment  with your caregiver to find out the results. Do not assume everything is normal if you have not heard from your caregiver or the medical facility. It is important for you to follow up on all of your test results.  SEEK IMMEDIATE MEDICAL ATTENTION IF:  You have more than a spotting of blood in your stool.   Your belly is swollen (abdominal distention).   You are nauseated or vomiting.   You have a temperature over 101.   You have abdominal pain or discomfort that is severe or gets worse throughout the day.    Hemorrhoid information provided  Course of [place] suppositories 1 per rectum at bedtime  Office visit with [place] in 6 weeks     Hemorrhoids Hemorrhoids are swollen veins in and around the rectum or anus. There are two types of hemorrhoids:  Internal hemorrhoids. These occur in the veins that are just inside the rectum. They may poke through to the outside and become irritated and painful.  External hemorrhoids. These occur in the veins that are outside of the anus and can be felt as a painful swelling or hard lump near the anus.  Most hemorrhoids do not cause serious problems, and they can be managed with home treatments such as diet and lifestyle changes. If home treatments do not help your symptoms, procedures can be done to shrink or remove the hemorrhoids. What are the causes? This condition is caused by increased pressure in the  anal area. This pressure may result from various things, including:  Constipation.  Straining to have a bowel movement.  Diarrhea.  Pregnancy.  Obesity.  Sitting for long periods of time.  Heavy lifting or other activity that causes you to strain.  Anal sex.  What are the signs or symptoms? Symptoms of this condition include:  Pain.  Anal itching or irritation.  Rectal bleeding.  Leakage of stool (feces).  Anal swelling.  One or more lumps around the anus.  How is this diagnosed? This condition can often be diagnosed  through a visual exam. Other exams or tests may also be done, such as:  Examination of the rectal area with a gloved hand (digital rectal exam).  Examination of the anal canal using a small tube (anoscope).  A blood test, if you have lost a significant amount of blood.  A test to look inside the colon (sigmoidoscopy or colonoscopy).  How is this treated? This condition can usually be treated at home. However, various procedures may be done if dietary changes, lifestyle changes, and other home treatments do not help your symptoms. These procedures can help make the hemorrhoids smaller or remove them completely. Some of these procedures involve surgery, and others do not. Common procedures include:  Rubber band ligation. Rubber bands are placed at the base of the hemorrhoids to cut off the blood supply to them.  Sclerotherapy. Medicine is injected into the hemorrhoids to shrink them.  Infrared coagulation. A type of light energy is used to get rid of the hemorrhoids.  Hemorrhoidectomy surgery. The hemorrhoids are surgically removed, and the veins that supply them are tied off.  Stapled hemorrhoidopexy surgery. A circular stapling device is used to remove the hemorrhoids and use staples to cut off the blood supply to them.  Follow these instructions at home: Eating and drinking  Eat foods that have a lot of fiber in them, such as whole grains, beans, nuts, fruits, and vegetables. Ask your health care provider about taking products that have added fiber (fiber supplements).  Drink enough fluid to keep your urine clear or pale yellow. Managing pain and swelling  Take warm sitz baths for 20 minutes, 3-4 times a day to ease pain and discomfort.  If directed, apply ice to the affected area. Using ice packs between sitz baths may be helpful. ? Put ice in a plastic bag. ? Place a towel between your skin and the bag. ? Leave the ice on for 20 minutes, 2-3 times a day. General  instructions  Take over-the-counter and prescription medicines only as told by your health care provider.  Use medicated creams or suppositories as told.  Exercise regularly.  Go to the bathroom when you have the urge to have a bowel movement. Do not wait.  Avoid straining to have bowel movements.  Keep the anal area dry and clean. Use wet toilet paper or moist towelettes after a bowel movement.  Do not sit on the toilet for long periods of time. This increases blood pooling and pain. Contact a health care provider if:  You have increasing pain and swelling that are not controlled by treatment or medicine.  You have uncontrolled bleeding.  You have difficulty having a bowel movement, or you are unable to have a bowel movement.  You have pain or inflammation outside the area of the hemorrhoids. This information is not intended to replace advice given to you by your health care provider. Make sure you discuss any questions you have with  your health care provider. Document Released: 03/12/2000 Document Revised: 08/13/2015 Document Reviewed: 11/27/2014 Elsevier Interactive Patient Education  2018 [employer] [facility]-ANESTHESIA  IMMEDIATELY FOLLOWING SURGERY:  Do not drive or operate machinery for the first twenty four hours after surgery.  Do not make any important decisions for twenty four hours after surgery or while taking narcotic pain medications or sedatives.  If you develop intractable nausea and vomiting or a severe headache please notify your doctor immediately.  FOLLOW-UP:  Please make an appointment with your surgeon as instructed. You do not need to follow up with anesthesia unless specifically instructed to do so.  WOUND CARE INSTRUCTIONS (if applicable):  Keep a dry clean dressing on the anesthesia/puncture wound site if there is drainage.  Once the wound has quit draining you may leave it open to air.  Generally you should leave the bandage  intact for twenty four hours unless there is drainage.  If the epidural site drains for more than 36-48 hours please call the anesthesia department.  QUESTIONS?:  Please feel free to call your physician or the hospital operator if you have any questions, and they will be happy to assist you.

## 2017-06-09 NOTE — H&P (Signed)
@LOGO @   Primary Care Physician:  Tollie Pizza, MD Primary Gastroenterologist:  Dr. Gala Romney  Pre-Procedure History & Physical: HPI:  Gabrielle Scott is a 42 y.o. female here for colonoscopy to further evaluate hematochezia.   Started on Amitiza recently which has has been of great benefit in combating constipation symptoms.  Past Medical History:  Diagnosis Date  . Anal fissure   . Anxiety   . Benign neoplasm of stomach   . Constipation   . Elevated serum creatinine   . Folliculitis   . GERD (gastroesophageal reflux disease)   . Hemorrhoids   . Hypertension   . Overweight   . Palpitations   . Prediabetes   . Schizoaffective disorder ([place])   . Tachycardia   . White coat syndrome with diagnosis of hypertension     Past Surgical History:  Procedure Laterality Date  . BREAST LUMPECTOMY Left    Cannot remember date; non-cancerous  . gastric polypectomy  2013  . WISDOM TOOTH EXTRACTION      Prior to Admission medications   Medication Sig Start Date End Date Taking? Authorizing Provider  Calcium Carbonate-Vitamin D (CALCIUM 600/VITAMIN D PO) Take 1 tablet by mouth 2 (two) times daily.   Yes [provider]  cetirizine (ZYRTEC) 10 MG tablet Take 10 mg by mouth daily as needed for allergies.    Yes [provider]  DEXILANT 60 MG capsule TAKE (1) CAPSULE BY MOUTH ONCE DAILY. 03/30/17  Yes Annitta Needs, NP  hydrocortisone (PROCTOZONE-HC) 2.5 % rectal cream Place 1 application rectally 2 (two) times daily. Patient taking differently: Place 1 application rectally 2 (two) times daily as needed for hemorrhoids.  02/19/17  Yes Isla Pence, MD  LORazepam (ATIVAN) 0.5 MG tablet Take 0.5 mg by mouth 2 (two) times daily as needed for anxiety.    Yes [provider]  losartan-hydrochlorothiazide (HYZAAR) 50-12.5 MG tablet Take 1 tablet by mouth daily.   Yes [provider]  lubiprostone (AMITIZA) 8 MCG capsule Take 1 capsule (8 mcg total) by mouth 2  (two) times daily with a meal. 05/26/17  Yes Carlis Stable, NP  metoprolol succinate (TOPROL-XL) 100 MG 24 hr tablet Take 100 mg by mouth daily. Take with or immediately following a meal.   Yes [provider]  mupirocin ointment (BACTROBAN) 2 % Apply 1 application topically daily as needed (rash).    Yes [provider]  potassium chloride 20 MEQ TBCR Take 20 mEq by mouth 2 (two) times daily for 5 days. 06/03/17 06/09/17 Yes Carlis Stable, NP  QUEtiapine (SEROQUEL) 25 MG tablet Take 25 mg by mouth at bedtime.   Yes [provider]  risperiDONE microspheres (RISPERDAL CONSTA) 50 MG injection Inject 50 mg into the muscle every 14 (fourteen) days.   Yes [provider]  MedroxyPROGESTERone Acetate (DEPO-PROVERA IM) Inject into the muscle every 3 (three) months.    [provider]    Allergies as of 05/11/2017  . (No Known Allergies)    Family History  Problem Relation Age of Onset  . Colon cancer Neg Hx   . Gastric cancer Neg Hx   . Esophageal cancer Neg Hx     Social History   Socioeconomic History  . Marital status: Single    Spouse name: Not on file  . Number of children: Not on file  . Years of education: Not on file  . Highest education level: Not on file  Social Needs  . Financial resource strain:  Not on file  . Food insecurity - worry: Not on file  . Food insecurity - inability: Not on file  . Transportation needs - medical: Not on file  . Transportation needs - non-medical: Not on file  Occupational History  . Not on file  Tobacco Use  . Smoking status: Never Smoker  . Smokeless tobacco: Never Used  Substance and Sexual Activity  . Alcohol use: No  . Drug use: No  . Sexual activity: Yes    Birth control/protection: Injection  Other Topics Concern  . Not on file  Social History Narrative  . Not on file    Review of Systems: See HPI, otherwise negative ROS  Physical Exam: BP (!) 131/98   Pulse 91   Temp 98.7 F (37.1  C) (Oral)   Resp 12   SpO2 97%  General:   Alert,  Well-developed, well-nourished, pleasant and cooperative in NAD Neck:  Supple; no masses or thyromegaly. No significant cervical adenopathy. Lungs:  Clear throughout to auscultation.   No wheezes, crackles, or rhonchi. No acute distress. Heart:  Regular rate and rhythm; no murmurs, clicks, rubs,  or gallops. Abdomen: Non-distended, normal bowel sounds.  Soft and nontender without appreciable mass or hepatosplenomegaly.  Pulses:  Normal pulses noted. Extremities:  Without clubbing or edema.  Impression  Pleasant 42 year old ladyhere for colonoscopy to further evaluate rectal bleeding.e here for colonoscopy forrectal bleeding.   Recommendations: I have offered the patient a diagnostic colonoscopy today.  The risks, benefits, limitations, alternatives and imponderables have been reviewed with the patient. Questions have been answered. All parties are agreeable.

## 2017-06-15 ENCOUNTER — Encounter (HOSPITAL_COMMUNITY): Payer: Self-pay | Admitting: Internal Medicine

## 2017-07-27 ENCOUNTER — Other Ambulatory Visit: Payer: Self-pay | Admitting: Gastroenterology

## 2017-07-27 ENCOUNTER — Encounter: Payer: Self-pay | Admitting: Internal Medicine

## 2017-07-27 DIAGNOSIS — K219 Gastro-esophageal reflux disease without esophagitis: Secondary | ICD-10-CM

## 2017-08-10 ENCOUNTER — Ambulatory Visit: Payer: Medicaid Other | Admitting: Nurse Practitioner

## 2017-08-30 ENCOUNTER — Ambulatory Visit: Payer: Medicaid Other | Admitting: Nurse Practitioner

## 2017-08-30 ENCOUNTER — Encounter: Payer: Self-pay | Admitting: Nurse Practitioner

## 2017-08-30 VITALS — BP 122/79 | HR 88 | Temp 98.2°F | Ht 67.0 in | Wt 154.0 lb

## 2017-08-30 DIAGNOSIS — K219 Gastro-esophageal reflux disease without esophagitis: Secondary | ICD-10-CM | POA: Diagnosis not present

## 2017-08-30 DIAGNOSIS — K5900 Constipation, unspecified: Secondary | ICD-10-CM | POA: Diagnosis not present

## 2017-08-30 DIAGNOSIS — K648 Other hemorrhoids: Secondary | ICD-10-CM | POA: Diagnosis not present

## 2017-08-30 NOTE — Assessment & Plan Note (Signed)
Constipation is significantly improved on Amitiza.  Having regular bowel movements which are soft.  No further rectal bleeding.  Hemorrhoids have not improved significantly as well, although she is on naproxen.  Recommend she strive for adequate hydration during the warm summer months.  Continue adequate water and fiber intake, continue Amitiza.  Notify us or her pharmacy if she needs a refill.  Follow-up in 6 months.

## 2017-08-30 NOTE — Assessment & Plan Note (Signed)
Internal hemorrhoids noted on colonoscopy.  This is deemed likely source of occasional hematochezia.  She is currently on proctoscopy zone and her rectal pain and irritation is much improved.  No further rectal bleeding.  Recommend she continue to monitor symptoms, follow-up in 6 months.  Call if she has any worsening symptoms or concerns.

## 2017-08-30 NOTE — Progress Notes (Signed)
Referring Provider: Tollie Pizza, MD Primary Care Physician:  Tollie Pizza, MD Primary GI:  Dr. Gala Romney  Chief Complaint  Patient presents with  . Gastroesophageal Reflux    doing ok    HPI:   Gabrielle Scott is a 42 y.o. female who presents for follow-up on GERD and constipation.  The patient was last seen in our office 05/11/2017 for the same as well as hematochezia.  Chronic history of GERD, adverse effect to Protonix in the form of diarrhea.  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  She has had some success with Dexilant.  She was still having constipation with a bowel movement 2-3 times a week.  She admitted abdominal pain which improves after bowel movement.  Recommended Linzess to 72 mcg.  This was too much for her at her last visit she states she stopped taking it.  She was on MiraLAX as needed and having a bowel movement 3 times a week with hard stools and straining.  Dexilant was doing well.  Some hematochezia about every [redacted] weeks along with hemorrhoid symptoms.  Having diarrhea after passing a hard stool as per his diarrhea.  No other GI symptoms.  Recommended Amitiza 8 mcg twice a day with samples given and request a progress report, colonoscopy for rectal bleeding, follow-up in 3 months.  A progress report was received from the patient indicating Amitiza was working well.  We will sent a prescription to your pharmacy.  Today she states she's doing well. Colonoscopy went well. Amitiza still working for her constipation. No further rectal bleeding. No more hemorrhoid irritation/pain. Proctozone working well. GERD is doing well on PPI (Dexilant), no breakthrough symptoms. Denies dysphagia, abdominal pain, N/V, fever, chills, hematochezia, melena, unintentional weight loss. Denies chest pain, dyspnea, dizziness, lightheadedness, syncope, near syncope. Denies any other upper or lower GI  symptoms.  Past Medical History:  Diagnosis Date  . Anal fissure   . Anxiety   . Benign neoplasm of stomach   . Constipation   . Elevated serum creatinine   . Folliculitis   . GERD (gastroesophageal reflux disease)   . Hemorrhoids   . Hypertension   . Overweight   . Palpitations   . Prediabetes   . Schizoaffective disorder ([place])   . Tachycardia   . White coat syndrome with diagnosis of hypertension     Past Surgical History:  Procedure Laterality Date  . BREAST LUMPECTOMY Left    Cannot remember date; non-cancerous  . COLONOSCOPY WITH PROPOFOL N/A 06/09/2017   Procedure: COLONOSCOPY WITH PROPOFOL;  Surgeon: Daneil Dolin, MD;  Location: [facility];  Service: Endoscopy;  Laterality: N/A;  1:30pm  . gastric polypectomy  2013  . WISDOM TOOTH EXTRACTION      Current Outpatient Medications  Medication Sig Dispense Refill  . Calcium Carbonate-Vitamin D (CALCIUM 600/VITAMIN D PO) Take 1 tablet by mouth 2 (two) times daily.    . cetirizine (ZYRTEC) 10 MG tablet Take 10 mg by mouth daily as needed for allergies.     . DEXILANT 60 MG capsule TAKE (1) CAPSULE BY MOUTH ONCE DAILY. 30 capsule 3  . hydrocortisone (PROCTOZONE-HC) 2.5 % rectal cream Place 1 application rectally 2 (two) times daily. (Patient taking differently: Place 1 application rectally 2 (two) times daily as needed for hemorrhoids. ) 30 g 0  . LORazepam (ATIVAN) 0.5 MG tablet Take 0.5 mg by  mouth 2 (two) times daily as needed for anxiety.     Marland Kitchen losartan-hydrochlorothiazide (HYZAAR) 50-12.5 MG tablet Take 1 tablet by mouth daily.    Marland Kitchen lubiprostone (AMITIZA) 8 MCG capsule Take 1 capsule (8 mcg total) by mouth 2 (two) times daily with a meal. 30 capsule 3  . MedroxyPROGESTERone Acetate (DEPO-PROVERA IM) Inject into the muscle every 3 (three) months.    . metoprolol succinate (TOPROL-XL) 100 MG 24 hr tablet Take 100 mg by mouth daily. Take with or immediately following a meal.    . mupirocin ointment (BACTROBAN) 2 %  Apply 1 application topically daily as needed (rash).     . QUEtiapine (SEROQUEL) 25 MG tablet Take 25 mg by mouth at bedtime.    . risperiDONE microspheres (RISPERDAL CONSTA) 50 MG injection Inject 50 mg into the muscle every 14 (fourteen) days.    . potassium chloride 20 MEQ TBCR Take 20 mEq by mouth 2 (two) times daily for 5 days. 10 tablet 0   No current facility-administered medications for this visit.     Allergies as of 08/30/2017  . (No Known Allergies)    Family History  Problem Relation Age of Onset  . Colon cancer Neg Hx   . Gastric cancer Neg Hx   . Esophageal cancer Neg Hx     Social History   Socioeconomic History  . Marital status: Single    Spouse name: Not on file  . Number of children: Not on file  . Years of education: Not on file  . Highest education level: Not on file  Occupational History  . Not on file  Social Needs  . Financial resource strain: Not on file  . Food insecurity:    Worry: Not on file    Inability: Not on file  . Transportation needs:    Medical: Not on file    Non-medical: Not on file  Tobacco Use  . Smoking status: Never Smoker  . Smokeless tobacco: Never Used  Substance and Sexual Activity  . Alcohol use: No  . Drug use: No  . Sexual activity: Yes    Birth control/protection: Injection  Lifestyle  . Physical activity:    Days per week: Not on file    Minutes per session: Not on file  . Stress: Not on file  Relationships  . Social connections:    Talks on phone: Not on file    Gets together: Not on file    Attends religious service: Not on file    Active member of club or organization: Not on file    Attends meetings of clubs or organizations: Not on file    Relationship status: Not on file  Other Topics Concern  . Not on file  Social History Narrative  . Not on file    Review of Systems: General: Negative for anorexia, weight loss, fever, chills, fatigue, weakness. ENT: Negative for hoarseness, difficulty  swallowing. CV: Negative for chest pain, angina, palpitations, peripheral edema.  Respiratory: Negative for dyspnea at rest, cough, sputum, wheezing.  GI: See history of present illness. Psych: Negative for anxiety, depression, suicidal ideation, hallucinations.  Endo: Negative for unusual weight change.  Heme: Negative for bruising or bleeding.   Physical Exam: BP 122/79   Pulse 88   Temp 98.2 F (36.8 C) (Oral)   Ht 5\' 7"  (1.702 m)   Wt 154 lb (69.9 kg)   BMI 24.12 kg/m  General:   Alert and oriented. Pleasant and cooperative. Well-nourished and well-developed.  Eyes:  Without icterus, sclera clear and conjunctiva pink.  Ears:  Normal auditory acuity. Cardiovascular:  S1, S2 present without murmurs appreciated. Extremities without clubbing or edema. Respiratory:  Clear to auscultation bilaterally. No wheezes, rales, or rhonchi. No distress.  Gastrointestinal:  +BS, soft, non-tender and non-distended. No HSM noted. No guarding or rebound. No masses appreciated.  Rectal:  Deferred  Musculoskalatal:  Symmetrical without gross deformities. Neurologic:  Alert and oriented x4;  grossly normal neurologically. Psych:  Alert and cooperative. Normal mood and affect. Heme/Lymph/Immune: No excessive bruising noted.    08/30/2017 2:35 PM   Disclaimer: This note was dictated with voice recognition software. Similar sounding words can inadvertently be transcribed and may not be corrected upon review.

## 2017-08-30 NOTE — Assessment & Plan Note (Signed)
GERD continues to do well on Dexilant.  Recommend she continue PPI, follow-up in 6 months.  Notify us of any worsening symptoms or any breakthrough symptoms.

## 2017-08-30 NOTE — Progress Notes (Signed)
cc'd to pcp 

## 2017-08-30 NOTE — Patient Instructions (Signed)
1. Continue taking your medications, specifically Dexilant for your heartburn, Amitiza for your constipation, and Proctofoam rectal cream as needed for hemorrhoid symptoms. 2. Follow-up in 6 months. 3. Call us if you have any questions or concerns  At [facility] we value your feedback. You may receive a survey about your visit today. Please share your experience as we strive to create trusting relationships with our patients to provide genuine, compassionate, quality care.  It was very nice to see you today!  I am glad you are doing better and hope you have a wonderful summer!!

## 2017-12-29 ENCOUNTER — Telehealth: Payer: Self-pay

## 2017-12-29 NOTE — Telephone Encounter (Signed)
PA for Dexilant was faxed over to the [facility] department [phone]. Waiting on an approval or denial.

## 2017-12-29 NOTE — Telephone Encounter (Signed)
Pt left a VM asking about the authorization of her medication. Returned pts call and informed pt that we've submitted a PA to her insurance and we are waiting on an approval or denial. Pt will be notified when we hear back from the insurance company.

## 2017-12-30 ENCOUNTER — Telehealth: Payer: Self-pay | Admitting: Internal Medicine

## 2017-12-30 NOTE — Telephone Encounter (Signed)
Pt called to see if her Insurance had approved her getting dexilant. I told her AM wasn't here today and AM told her yesterday that she would call once she hears back from [employer]. Pt is asking to get samples to hold her over. Please advise [phone]

## 2018-01-02 NOTE — Telephone Encounter (Signed)
Spoke with pt. Dexilant PA was approved through 10-4/19-12-25-18. Pt will call her pharmacy to pick medication up. Approval letter will be scanned in chart when received.

## 2018-03-01 ENCOUNTER — Ambulatory Visit: Payer: Medicaid Other | Admitting: Nurse Practitioner

## 2018-03-01 ENCOUNTER — Encounter: Payer: Self-pay | Admitting: Nurse Practitioner

## 2018-03-01 VITALS — BP 141/97 | HR 84 | Temp 97.9°F | Ht 67.0 in | Wt 155.4 lb

## 2018-03-01 DIAGNOSIS — K5900 Constipation, unspecified: Secondary | ICD-10-CM

## 2018-03-01 DIAGNOSIS — K219 Gastro-esophageal reflux disease without esophagitis: Secondary | ICD-10-CM | POA: Diagnosis not present

## 2018-03-01 NOTE — Progress Notes (Signed)
Referring Provider: Tollie Pizza, MD Primary Care Physician:  Tollie Pizza, MD Primary GI:  Dr. Gala Romney  Chief Complaint  Patient presents with  . Gastroesophageal Reflux    doing ok    HPI:   Gabrielle Scott is a 42 y.o. female who presents for follow-up on GERD and constipation.  The patient was last seen in our office 08/30/2017 for the same as well as internal hemorrhoids.  Chronic history of GERD with adverse effect of Protonix (diarrhea).  She has had some better management with Dexilant. 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  At her last visit she was doing well, colonoscopy had recently been completed and outlined as per below.  Amitiza working well for constipation no further rectal bleeding.  No more hemorrhoid symptoms.  [place] zone working well.  [place] working well in managing her GERD well.  Denies breakthrough symptoms.  No other GI symptoms.  Recommended continue current medications, follow-up in 6 months.  Colonoscopy up-to-date and completed 06/09/2017 which found normal colon, internal hemorrhoids as likely source of hematochezia.  Recommended continue current medications, course of [place] suppositories, follow-up in 6 weeks, repeat colonoscopy in 10 years.  Series of phone notes about 2 months ago indicating prior authorization submitted to insurance which is eventually approved for continued Dexilant.  Today she states she's doing well overall. GERD doing well on Dexilant. Constipation doing well on Amitiza. Denies abdominal pain, N/V, fever, chills, unintentional weight loss. Objectively she is within 1 lb of her weight 6 months ago. Sometimes with have diarrhea for one day, occurs rarely. Denies chest pain, dyspnea, dizziness, lightheadedness, syncope, near syncope. Denies any other upper or lower GI symptoms.  Past Medical History:  Diagnosis Date  . Anal fissure     . Anxiety   . Benign neoplasm of stomach   . Constipation   . Elevated serum creatinine   . Folliculitis   . GERD (gastroesophageal reflux disease)   . Hemorrhoids   . Hypertension   . Overweight   . Palpitations   . Prediabetes   . Schizoaffective disorder ([place])   . Tachycardia   . White coat syndrome with diagnosis of hypertension     Past Surgical History:  Procedure Laterality Date  . BREAST LUMPECTOMY Left    Cannot remember date; non-cancerous  . COLONOSCOPY WITH PROPOFOL N/A 06/09/2017   Procedure: COLONOSCOPY WITH PROPOFOL;  Surgeon: Daneil Dolin, MD;  Location: [facility];  Service: Endoscopy;  Laterality: N/A;  1:30pm  . gastric polypectomy  2013  . WISDOM TOOTH EXTRACTION      Current Outpatient Medications  Medication Sig Dispense Refill  . Calcium Carbonate-Vitamin D (CALCIUM 600/VITAMIN D PO) Take 1 tablet by mouth 2 (two) times daily.    . cetirizine (ZYRTEC) 10 MG tablet Take 10 mg by mouth daily as needed for allergies.     . DEXILANT 60 MG capsule TAKE (1) CAPSULE BY MOUTH ONCE DAILY. 30 capsule 3  . hydrocortisone (PROCTOZONE-HC) 2.5 % rectal cream Place 1 application rectally 2 (two) times daily. (Patient taking differently: Place 1 application rectally 2 (two) times daily as needed for hemorrhoids. ) 30 g 0  . LORazepam (ATIVAN) 0.5 MG tablet Take 0.5 mg by mouth 2 (two) times daily as needed for anxiety.     Marland Kitchen losartan-hydrochlorothiazide (HYZAAR) 50-12.5 MG tablet Take 1 tablet by mouth daily.    Marland Kitchen  lubiprostone (AMITIZA) 8 MCG capsule Take 1 capsule (8 mcg total) by mouth 2 (two) times daily with a meal. 30 capsule 3  . MedroxyPROGESTERone Acetate (DEPO-PROVERA IM) Inject into the muscle every 3 (three) months.    . metoprolol succinate (TOPROL-XL) 100 MG 24 hr tablet Take 100 mg by mouth daily. Take with or immediately following a meal.    . mupirocin ointment (BACTROBAN) 2 % Apply 1 application topically daily as needed (rash).     . QUEtiapine  (SEROQUEL) 25 MG tablet Take 25 mg by mouth at bedtime.    . risperiDONE microspheres (RISPERDAL CONSTA) 50 MG injection Inject 50 mg into the muscle every 14 (fourteen) days.    . potassium chloride 20 MEQ TBCR Take 20 mEq by mouth 2 (two) times daily for 5 days. 10 tablet 0   No current facility-administered medications for this visit.     Allergies as of 03/01/2018  . (No Known Allergies)    Family History  Problem Relation Age of Onset  . Colon cancer Neg Hx   . Gastric cancer Neg Hx   . Esophageal cancer Neg Hx     Social History   Socioeconomic History  . Marital status: Single    Spouse name: Not on file  . Number of children: Not on file  . Years of education: Not on file  . Highest education level: Not on file  Occupational History  . Not on file  Social Needs  . Financial resource strain: Not on file  . Food insecurity:    Worry: Not on file    Inability: Not on file  . Transportation needs:    Medical: Not on file    Non-medical: Not on file  Tobacco Use  . Smoking status: Never Smoker  . Smokeless tobacco: Never Used  Substance and Sexual Activity  . Alcohol use: No  . Drug use: No  . Sexual activity: Yes    Birth control/protection: Injection  Lifestyle  . Physical activity:    Days per week: Not on file    Minutes per session: Not on file  . Stress: Not on file  Relationships  . Social connections:    Talks on phone: Not on file    Gets together: Not on file    Attends religious service: Not on file    Active member of club or organization: Not on file    Attends meetings of clubs or organizations: Not on file    Relationship status: Not on file  Other Topics Concern  . Not on file  Social History Narrative  . Not on file    Review of Systems: General: Negative for anorexia, weight loss, fever, chills, fatigue, weakness. ENT: Negative for hoarseness, difficulty swallowing. CV: Negative for chest pain, angina, palpitations, peripheral  edema.  Respiratory: Negative for dyspnea at rest, cough, sputum, wheezing.  GI: See history of present illness. Endo: Negative for unusual weight change.  Heme: Negative for bruising or bleeding.   Physical Exam: BP (!) 141/97   Pulse 84   Temp 97.9 F (36.6 C) (Oral)   Ht 5\' 7"  (1.702 m)   Wt 155 lb 6.4 oz (70.5 kg)   BMI 24.34 kg/m  General:   Alert and oriented. Pleasant and cooperative. Well-nourished and well-developed.  Eyes:  Without icterus, sclera clear and conjunctiva pink.  Ears:  Normal auditory acuity. Cardiovascular:  S1, S2 present without murmurs appreciated. Extremities without clubbing or edema. Respiratory:  Clear to auscultation bilaterally.  No wheezes, rales, or rhonchi. No distress.  Gastrointestinal:  +BS, soft, non-tender and non-distended. No HSM noted. No guarding or rebound. No masses appreciated.  Rectal:  Deferred  Musculoskalatal:  Symmetrical without gross deformities. Neurologic:  Alert and oriented x4;  grossly normal neurologically. Psych:  Alert and cooperative. Normal mood and affect. Heme/Lymph/Immune: No excessive bruising noted.    03/01/2018 1:46 PM   Disclaimer: This note was dictated with voice recognition software. Similar sounding words can inadvertently be transcribed and may not be corrected upon review.

## 2018-03-01 NOTE — Assessment & Plan Note (Signed)
No breakthrough constipation with Amitiza.  Recommend she continue Amitiza, follow-up in 1 year.  Call if any worsening symptoms before then.

## 2018-03-01 NOTE — Assessment & Plan Note (Signed)
Continues to do well on Dexilant.  Recommend she continue her current medications and follow-up in 1 year.

## 2018-03-01 NOTE — Patient Instructions (Signed)
1. Continue taking your current medicines. 2. Return for follow-up in 1 year. 3. Call us if you have any worsening symptoms, questions, or concerns before then.  At [facility] we value your feedback. You may receive a survey about your visit today. Please share your experience as we strive to create trusting relationships with our patients to provide genuine, compassionate, quality care.  We appreciate your understanding and patience as we review any laboratory studies, imaging, and other diagnostic tests that are ordered as we care for you. Our office policy is 5 business days for review of these results, and any emergent or urgent results are addressed in a timely manner for your best interest. If you do not hear from our office in 1 week, please contact us.   We also encourage the use of MyChart, which contains your medical information for your review as well. If you are not enrolled in this feature, an access code is on this after visit summary for your convenience. Thank you for allowing [place] to be involved in your care.  It was great to see you today!  I hope you have a Merry Christmas!!

## 2018-03-02 NOTE — Progress Notes (Signed)
CC'D TO PCP °

## 2018-03-28 ENCOUNTER — Other Ambulatory Visit: Payer: Self-pay | Admitting: Gastroenterology

## 2018-03-28 DIAGNOSIS — K219 Gastro-esophageal reflux disease without esophagitis: Secondary | ICD-10-CM

## 2018-09-27 ENCOUNTER — Other Ambulatory Visit: Payer: Self-pay | Admitting: Gastroenterology

## 2018-09-27 DIAGNOSIS — K219 Gastro-esophageal reflux disease without esophagitis: Secondary | ICD-10-CM

## 2018-10-30 ENCOUNTER — Telehealth: Payer: Self-pay | Admitting: *Deleted

## 2018-10-30 NOTE — Telephone Encounter (Signed)
Pt having trouble getting her RX filled at [facility].  We did 90 day supply on 09/27/2018.  Can we help?  Pt's # [phone]

## 2018-10-30 NOTE — Telephone Encounter (Signed)
Spoke with pt. PA is needed.

## 2018-10-31 ENCOUNTER — Telehealth: Payer: Self-pay | Admitting: Internal Medicine

## 2018-10-31 NOTE — Telephone Encounter (Signed)
Patient called and said that she only has 2 more dexilant pills.  Her pharmacy said she needed authorization for a refill.  Please advise

## 2018-10-31 NOTE — Telephone Encounter (Signed)
Pt was in my VM that she was out of pills and couldn't get an authorization to go through and she needed help getting her pills. Please call her back at [phone]

## 2018-10-31 NOTE — Telephone Encounter (Signed)
New PA was submitted by phone for a 90 day supply of Dexilant 60 mg. PA was approved through 10/26/2019. Ref # [id number], PA # [id number]. Pt is aware of approval.

## 2018-10-31 NOTE — Telephone Encounter (Signed)
This call was previously addressed. See other phone note.

## 2018-11-27 ENCOUNTER — Other Ambulatory Visit: Payer: Self-pay | Admitting: Gastroenterology

## 2018-11-27 DIAGNOSIS — K219 Gastro-esophageal reflux disease without esophagitis: Secondary | ICD-10-CM

## 2019-02-20 ENCOUNTER — Encounter: Payer: Self-pay | Admitting: Internal Medicine

## 2019-03-07 ENCOUNTER — Telehealth: Payer: Self-pay | Admitting: Gastroenterology

## 2019-03-07 ENCOUNTER — Ambulatory Visit (INDEPENDENT_AMBULATORY_CARE_PROVIDER_SITE_OTHER): Payer: Medicaid Other | Admitting: Gastroenterology

## 2019-03-07 ENCOUNTER — Other Ambulatory Visit: Payer: Self-pay

## 2019-03-07 ENCOUNTER — Encounter: Payer: Self-pay | Admitting: Gastroenterology

## 2019-03-07 VITALS — BP 138/96 | HR 79 | Temp 97.5°F | Ht 67.0 in | Wt 158.8 lb

## 2019-03-07 DIAGNOSIS — K3189 Other diseases of stomach and duodenum: Secondary | ICD-10-CM

## 2019-03-07 DIAGNOSIS — K219 Gastro-esophageal reflux disease without esophagitis: Secondary | ICD-10-CM

## 2019-03-07 MED ORDER — DEXILANT 60 MG PO CPDR: 60.0000 mg | DELAYED_RELEASE_CAPSULE | Freq: Every day | ORAL | 3 refills | Status: DC

## 2019-03-07 NOTE — Assessment & Plan Note (Signed)
Obtain prior records to try to determine if any follow-up was recommended i.e. repeat endoscopy.  Patient believes she is supposed to have a follow-up EGD but this is not clear to [place] based on records that are visible in care everywhere.

## 2019-03-07 NOTE — Progress Notes (Signed)
Primary Care Physician: Tollie Pizza, MD  Primary Gastroenterologist:  Garfield Cornea, MD   Chief Complaint  Patient presents with  . Gastroesophageal Reflux    f/u. Doing okay    HPI: Gabrielle Scott is a 43 y.o. female here for 1 year follow-up.  She was seen in December 2019.  She has a history of GERD and constipation. Colonoscopy completed March 2019, she had internal hemorrhoids.  Next colonoscopy in 10 years.  She has what sounds like a benign gastric nodule removed at [facility] remotely (2013), extensive review by in house and expert pathologist at [place] were overall inconclusive but felt to represent a benign finding. Advised for complete resection given unknowns, which she reports having.  Patient states she was supposed to follow-up and have upper endoscopy but she missed her appointment and never followed up.  Clinically she is doing well.  Reflux is well controlled.  No significant abdominal pain.  Bowel movements are regular.  No blood in the stool or melena.  Following path reports available in [place] but no office notes or endoscopy reports.   Patient: Gabrielle Scott, Gabrielle Scott  MRN: [id number]  DOB (Age): Sep 09, 1975 (Age: 51)  Collected: 08/05/2011  Received:08/05/2011  Completed: 08/12/2011    Surgical Pathology Report      Addenda Present    Accession #: [id number]    Diagnosis:  A: "Pancreatic rest," biopsy  - Antral-type gastric mucosa with foveolar hyperplasia, glandular dilation, and  lamina propria expansion by bland spindled cells (likely myofibroblasts) and  fibromyxoid stroma (see comment)  - Final diagnosis is pending the results of outside expert consultation    B: Esophagus, random, biopsy  - Squamous mucosa with rare intraepithelial eosinophils (up to 2 in a single  HPF) without eosinophil microabscess formation (see comment)    Comment:  Immunostains are performed on  specimen A. A cytokeratin AE1/AE3 immunostain  shows no evidence of carcinoma. A CD117 immunostain is positive in scattered  intercalated cells of [place], but is negative in the spindled cells. A CD34  immunostain is positive in blood vessels, but is negative in the spindled cells.  A muscle specific actin immunostain shows staining of many of the spindled  cells. A desmin immunostain is negative in the spindled cells, as is an S-100  immunostain.    This immunophenotype does not favor a gastrointestinal stromal tumor. The  differential diagnosis includes hyperplastic polyp, hamartomatous polyp,  inflammatory myofibroblastic tumor, inflammatory fibroid polyp, and plexiform  angiomyxoid myofibroblastic tumor. This case will be sent to the [facility] for an outside expert opinion. The outside findings will be reported as  an addendum to this report.    Preliminary findings were discussed with Dr. Rayvon Char by telephone on  08/12/11.    Correlation with the clinical and endoscopic findings is necessary to determine  the significance, if any, of the slight increase in intraepithelial eosinophils  within the esophagus (specimen B).        Clinical History:  43 year old female with dysphagia. EGD showed an approximately 1.5 cm  umbilicated nodule in the gastric antrum.    Gross Description:  Received are two appropriately labeled containers.    Specimen A:    SITE: "pancreatic rest, evaluate"  METHOD: biopsy  MEASURE: 6 x 4 x 2 mm  COMMENT: aggregate of pink/tan tissue fragments  BLOCK #: A1, NTR    Specimen B:    SITE: "random esophagus, rule out eosinophilic esophagitis"  METHOD: biopsy  MEASURE: 5 x 3 x 1 mm  COMMENT: aggregate of multiple white tissue fragments  BLOCK#: B1, NTR    (cb)      P.A.: Romero Belling, PA(ASCP)  gkf//08/06/2011    [place] Microscopy:  Light microscopic examination is  performed by Dr. Carlota Raspberry.    For cases in which immunostains are performed, the following applies:  Appropriate internal and/or external positive and negative controls have been  evaluated.Some of the immunohistochemical reagents used in this case may be  classified as analyte specific reagents (ASR). These were developed and have  performance characteristics determined by the Anatomic Pathology Department,  [facility].These reagents have not been cleared or  approved by the [place] Food and Drug Administration (FDA).The FDA has determined  that such clearance or approval is not necessary.These tests are used for  clinical purposes.They should not be regarded as investigational or for  research. This laboratory is certified under the [place] (CLIA-88) as qualified to perform high complexity  clinical laboratory testing.      Signature  Attending Pathologist: Nicholes Stairs, MD  abk(08/09/2011)  I have personally conducted the evaluation of the above specimens and have  rendered the above diagnosis(es).    Electronically Signed by:  Doristine Church Carlota Raspberry, MD  Date  08/12/2011    Procedures/Addenda:  Addendum    Addendum  To add the results of an outside expert consultation      Addendum Comment  This case was reviewed by Dr. Antony Salmon ([place]) who made the  following diagnosis and comment:    Stomach, biopsy: Benign mesenchymal polyp. See comment.    Comment:  "I have to begin by saying that I do not have a specific name for this gastric  polyp that was found in a 43 year old woman.As you said in your report, the  bulk of the polyp is composed of spindled mesenchymal cells, some with long  cytoplasmic processes.In one area, the cells are rounded or ovoid with  eosinophilic cytoplasm.I thought of several possibilities, including an   inflammatory fibroid polyp (although the number of eosinophils is fewer than  typically seen), a perineuroma, and others. I reviewed the immunohistochemical  stains that you sent, as well as performing an H & E stain, a trichrome stain,  and an EMA stain in our laboratory.The cells are negative for EMA, as well as  CD34, S100, desmin, and c-kit.These results essentially exclude a number of  possibilities including a gastrointestinal stromal tumor, a Schwann cell or  neural proliferation, a perineuroma, and an inflammatory fibroid polyp.The  proliferation produces some collagen as highlighted on the trichrome stain I  performed and the cells have some weak staining for muscle specific actin,  suggesting that they could be myofibroblasts.I am left without a specific name  for this process, although I think it is benign.I also shared this case with  my colleagues, Drs. Sylvester Harder and Joyce Gross, as well as Dr. Ernest Pine, a soft tissue pathologist, and none of them have a specific name either.  Everyone agreed that it was benign."      Electronically Signed: Doristine Church. Carlota Raspberry, MDDate:09/07/2011    Billing Fee Code(s)/CPT Code(s):  A: CKPan, CKit, CD34, Desmin, Actin, S-100, RECWKLD, RECWKLD, RECWKLD, RECWKLD,  RECWKLD  A,B: [id number]), [facility]      Patient:   Gabrielle Scott, Gabrielle Scott  MRN:     [id number]  DOB (Age):  02-20-1976 (Age: 45)  Collected:  09/02/2011  Received:  09/03/2011  Completed:  09/07/2011    Surgical Pathology Report        Accession #:  [id number]    Diagnosis:  A: Stomach, antrum, biopsy  - Antral-type gastric mucosa with foveolar hyperplasia, glandular dilation, and  expansion of lamina propria by cytologically bland spindled myofibroblasts  within a plasma cell-rich fibromyxoid stroma (see comment)    Comment:   Immunohistochemical/histochemical findings:    CD34: negative in spindle cell proliferation  ALK: negative in spindle cell proliferation  SMA: diffusely positive in spindle cell proliferation  Elastin: foci of elastin deposition in muscularis mucosae    The histologic features of the current biopsy are identical to those of the  previous biopsy ([id number]). The sections show a fibromyxoid myofibroblastic  proliferation with increased numbers of stromal plasma cells. Inflammatory  myofibroblastic tumor (IMT) enters the differential diagnosis, although these  lesions are typically much more cellular. IMT is often accompanied by systemic  symptoms and laboratory abnormalities such as: fever, malaise, weight loss,  anemia, thrombocytosis, polyclonal hyperglobulinemia, and elevated sed rate.  Are any of these clinical findings present? A positive ALK would have provided  support for IMT. However, since 50% of IMTs are ALK negative, this diagnosis is  not excluded. The significance of focal elastosis of muscularis mucosae is  unknown, but may represent a reactive change to the underlying nodule.    The overall interpretation is that of a benign myofibroblastic proliferation.  Given uncertainty about the precise diagnosis and possibility for continued  growth, complete removal should be considered.    This case was discussed with Dr. Loletta Specter on 09/06/2011.        Clinical History:  Gastric mucosa mass/polyp found on endoscopy.    Gross Description:  Received is one appropriately labeled container.    Specimen A:    SITE: Stomach, antrum, cold biopsy, nodule, rule out neoplasm  METHOD: Biopsy  MEASURE: 5 x 2 x 2 mm  COMMENT: Aggregate of soft tan/gray tissue and a scant amount of red/brown  tissue  BLOCK:  A1, NTR    Vertell Limber)      P.A.: Janine Ores, PA (ASCP)  lmf//09/03/2011    [place] Microscopy:  Light microscopic  examination is performed by Dr. Malva Cogan.    For cases in which immunostains are performed, the following applies:  Appropriate internal and/or external positive and negative controls have been  evaluated. Some of the immunohistochemical reagents used in this case may be  classified as analyte specific reagents (ASR). These were developed and have  performance characteristics determined by the Anatomic Pathology Department,  [facility]. These reagents have not been cleared or  approved by the [place] Food and Drug Administration (FDA). The FDA has determined  that such clearance or approval is not necessary. These tests are used for  clinical purposes. They should not be regarded as investigational or for  research.  This laboratory is certified under the [place] (CLIA-88) as qualified to perform high complexity  clinical laboratory testing.      Signature  Attending Pathologist: Lorin Glass [facility]  abk(09/06/2011)  I have personally conducted the evaluation of the above specimens and have  rendered the above diagnosis(es).    Electronically Signed by:  Danae Chen  Date  09/07/2011      Billing Fee Code(s)/CPT Code(s):  A: CD34, Actin, ALK-1, RECWKLD, RECWKLD, RECWKLD, RECWKLD, RECWKLD, RECWKLD,  RECWKLD, RECWKLD, [place], [place], [place], [place], [place], [place], [place],  [place], RECWKLD, RECWKLD, RECWKLD, RECWKLD, [place], [facility] [facility]      Patient: Gabrielle Scott, Gabrielle Scott  MRN: [id number]  DOB (Age): 1976-03-05 (Age: 28)  Collected: 10/13/2011  Received:10/14/2011  Completed: 10/18/2011    Surgical Pathology Report        Accession #: [id number]    Diagnosis:  A: Stomach, polypectomy  -Polypoid fragment of gastric fundic and antral type mucosa with glandular  dilatation and  lamina propria expansion by bland spindled myofibroblasts and  fibromyxoid stroma  -Inked margin appears negative for lesional tissue  -See comment      Comment:  Drs. Carlota Raspberry has reviewed this case and concurs.    Immunohistochemical stains were performed on block A1 and demonstrate the  myofibroblastic component is positive for desmin and muscle-specific actin.The  lesion is negative for S-100, ALK, and CD117 with CD34 outlining predominantly  blood vessels.    Material on this case has previously been reviewed by outside expert consultants  at the [facility] (see patient' s prior pathology [phone]) who  cannot give a specific name for this particulartype neoplasm but who feel that  is a benign mesenchymal polyp.Many of the features described in that case and  in the patient' s other prior pathology ([id number]) are seen here including the  myofibroblastic component to the lamina propria expansion and the fibromyxoid  stroma.While this lesion does not demonstrate any overt malignant features,  the inability to formally classify it means that its biologic potential can not  be definitively known; close clinical follow-up recommended.        Clinical History:  42 year old female who presents for therapeutic procedure for benign gastric  tumor.    Gross Description:  Received is one appropriately labeled container.    Specimen A:    SITE: "stomach, rule out dysplasia, rule out neoplasia"  METHOD: polypectomy  MEASURE: 12 x 11 x 6 mm  COMMENT: single tan polyp; stalk is inked blue; serially sectioned  BLOCK: A1, NTR    (ak)        P.A.: April E. Kemper, PA (ASCP)  gkf//10/14/2011    [place] Microscopy:  Light microscopic examination is performed by Dr. Barbette Reichmann.      Signature  Attending Pathologist: Natividad Brood [facility]  lmf(10/15/2011)  I have personally conducted the evaluation of  the above specimens and have  rendered the above diagnosis(es).    Electronically Signed by:  Natividad Brood TREMBATH  Date  10/18/2011      Billing Fee Code(s)/CPT Code(s):  A: Actin, Desmin, CD34, CKit, ALK-1, S-100, 88305  [facility]      Current Outpatient Medications  Medication Sig Dispense Refill  . Calcium Carbonate-Vitamin D (CALCIUM 600/VITAMIN D PO) Take 1 tablet by mouth 2 (two) times daily.    . cetirizine (ZYRTEC) 10 MG tablet Take 10 mg by mouth daily as needed for allergies.     . DEXILANT 60 MG capsule TAKE (1) CAPSULE BY MOUTH ONCE DAILY. 60 capsule 3  . hydrocortisone (PROCTOZONE-HC) 2.5 % rectal cream Place 1 application rectally 2 (two) times daily. (Patient taking differently: Place 1 application rectally 2 (two) times daily as needed for hemorrhoids. ) 30 g 0  . LORazepam (ATIVAN) 0.5 MG tablet Take 0.5 mg by mouth 2 (two) times daily as needed for anxiety.     Marland Kitchen losartan-hydrochlorothiazide (HYZAAR) 50-12.5 MG tablet Take 1 tablet by mouth daily.    Marland Kitchen  MedroxyPROGESTERone Acetate (DEPO-PROVERA IM) Inject into the muscle every 3 (three) months.    . metoprolol succinate (TOPROL-XL) 100 MG 24 hr tablet Take 100 mg by mouth daily. Take with or immediately following a meal.    . mupirocin ointment (BACTROBAN) 2 % Apply 1 application topically daily as needed (rash).     . QUEtiapine (SEROQUEL) 25 MG tablet Take 25 mg by mouth at bedtime.    . risperiDONE microspheres (RISPERDAL CONSTA) 50 MG injection Inject 50 mg into the muscle every 14 (fourteen) days.    Marland Kitchen lubiprostone (AMITIZA) 8 MCG capsule Take 1 capsule (8 mcg total) by mouth 2 (two) times daily with a meal. (Patient not taking: Reported on 03/07/2019) 30 capsule 3  . potassium chloride 20 MEQ TBCR Take 20 mEq by mouth 2 (two) times daily for 5 days. (Patient not taking: Reported on 03/07/2019) 10 tablet 0   No current facility-administered medications for this visit.      Allergies as of 03/07/2019  . (No Known Allergies)   Past Medical History:  Diagnosis Date  . Anal fissure   . Anxiety   . Benign neoplasm of stomach   . Constipation   . Elevated serum creatinine   . Folliculitis   . GERD (gastroesophageal reflux disease)   . Hemorrhoids   . Hypertension   . Overweight   . Palpitations   . Prediabetes   . Schizoaffective disorder ([place])   . Tachycardia   . White coat syndrome with diagnosis of hypertension    Past Surgical History:  Procedure Laterality Date  . BREAST LUMPECTOMY Left    Cannot remember date; non-cancerous  . COLONOSCOPY WITH PROPOFOL N/A 06/09/2017   Procedure: COLONOSCOPY WITH PROPOFOL;  Surgeon: Daneil Dolin, MD;  Location: [facility];  Service: Endoscopy;  Laterality: N/A;  1:30pm  . gastric polypectomy  2013  . WISDOM TOOTH EXTRACTION     Family History  Problem Relation Age of Onset  . Colon cancer Neg Hx   . Gastric cancer Neg Hx   . Esophageal cancer Neg Hx    Social History   Tobacco Use  . Smoking status: Never Smoker  . Smokeless tobacco: Never Used  Substance Use Topics  . Alcohol use: No  . Drug use: No    ROS:  General: Negative for anorexia, weight loss, fever, chills, fatigue, weakness. ENT: Negative for hoarseness, difficulty swallowing , nasal congestion. CV: Negative for chest pain, angina, palpitations, dyspnea on exertion, peripheral edema.  Respiratory: Negative for dyspnea at rest, dyspnea on exertion, cough, sputum, wheezing.  GI: See history of present illness. GU:  Negative for dysuria, hematuria, urinary incontinence, urinary frequency, nocturnal urination.  Endo: Negative for unusual weight change.    Physical Examination:   BP (!) 138/96   Pulse 79   Temp (!) 97.5 F (36.4 C) (Oral)   Ht _0  (1.702 m)   Wt 158 lb 12.8 oz (72 kg)   BMI 24.87 kg/m   General: Well-nourished, well-developed in no acute distress.  Eyes: No icterus. Mouth: Oropharyngeal mucosa  moist and pink , no lesions erythema or exudate. Lungs: Clear to auscultation bilaterally.  Heart: Regular rate and rhythm, no murmurs rubs or gallops.  Abdomen: Bowel sounds are normal, nontender, nondistended, no hepatosplenomegaly or masses, no abdominal bruits or hernia , no rebound or guarding.   Extremities: No lower extremity edema. No clubbing or deformities. Neuro: Alert and oriented x 4   Skin: Warm and dry, no jaundice.  Psych: Alert and cooperative, normal mood and affect.   Imaging Studies: No results found.

## 2019-03-07 NOTE — Assessment & Plan Note (Signed)
Doing well on Dexilant.  Continue 60 mg daily before breakfast.

## 2019-03-07 NOTE — Patient Instructions (Signed)
Continue Dexilant one daily before breakfast. New prescription sent to your pharmacy.  I will ask Dr. Gala Romney if he advises repeat upper endoscopy given prior history of stomach nodule. We will be in touch.

## 2019-03-07 NOTE — Telephone Encounter (Signed)
Can we request records of prior upper endoscopies there were several in 2013 at [facility]. Also need last three ov notes from [facility]. These records may be from 2013 and later.

## 2019-03-08 NOTE — Telephone Encounter (Signed)
I have mailed patient a release to sign and mail back to me.

## 2019-03-26 NOTE — Telephone Encounter (Signed)
Patient called and stated she forgot to put a stamp on the envelope we mailed her.  I'm going to mail out another release for her to sign.

## 2019-05-01 ENCOUNTER — Telehealth: Payer: Self-pay | Admitting: Gastroenterology

## 2019-05-01 ENCOUNTER — Encounter: Payer: Self-pay | Admitting: Gastroenterology

## 2019-05-01 NOTE — Telephone Encounter (Signed)
Please let pt know that we finally received [facility] records. I will be reviewing with Dr. Gala Romney and we will let her know if he advises updated EGD.

## 2019-05-01 NOTE — Telephone Encounter (Signed)
Spoke with pt. Pt notified that records were received and LSL will discuss further with RMR.

## 2019-05-09 NOTE — Progress Notes (Signed)
Received additional records from [facility].  Upper endoscopy October 13, 2011 esophagus normal.  1.5 cm ulcerated noncircumferential mass found in the gastric antrum.  Mucosal resection completed.  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 Carlota Raspberry has reviewed this case and concurs.    Immunohistochemical stains were performed on block A1 and demonstrate the  myofibroblastic component is positive for desmin and muscle-specific actin.The  lesion is negative for S-100, ALK, and CD117 with CD34 outlining predominantly  blood vessels.    Material on this case has previously been reviewed by outside expert consultants  at the [facility] (see patient' s prior pathology [phone]) who  cannot give a specific name for this particulartype neoplasm but who feel that  is a benign mesenchymal polyp.Many of the features described in that case and  in the patient' s other prior pathology ([id number]) are seen here including the  myofibroblastic component to the lamina propria expansion and the fibromyxoid  stroma.While this lesion does not demonstrate any overt malignant features,  the inability to formally classify it means that its biologic potential can not  be definitively known; close clinical follow-up recommended.        Upper endoscopy 08/05/2011: Normal esophagus.  Biopsies taken to evaluate for eosinophilic esophagitis.  Raised polypoid nodule with  central umbilication found in the gastric antrum consistent with pancreatic rest.  Gastritis.  Esophageal biopsies with rare intraepithelial eosinophils.  Gastric nodule biopsies not consistent with GIST.  Differential includes hyperplastic polyp, hamartomatous polyp, inflammatory myofibroblastic tumor, inflammatory fibroid polyp, plexiform angiomyxoid myofibroblastic tumor.  Sent to [place] for expert opinion.  Reread: Benign mesenchymal polyp.  "I am left without a specific name for this process, although I think it is benign"  Patient: Gabrielle Scott, Gabrielle Scott  MRN: [id number]  DOB (Age): 25-Mar-1976 (Age: 44)  Collected: 08/05/2011  Received:08/05/2011  Completed: 08/12/2011    Surgical Pathology Report      Addenda Present    Accession #: [id number]    Diagnosis:  A: "Pancreatic rest," biopsy  - Antral-type gastric mucosa with foveolar hyperplasia, glandular dilation, and  lamina propria expansion by bland spindled cells (likely myofibroblasts) and  fibromyxoid stroma (see comment)  - Final diagnosis is pending the results of outside expert consultation    B: Esophagus, random, biopsy  - Squamous mucosa with rare intraepithelial eosinophils (up to 2 in a single  HPF) without eosinophil microabscess formation (see comment)    Comment:  Immunostains are performed on specimen A. A cytokeratin AE1/AE3 immunostain  shows no evidence of carcinoma. A CD117 immunostain is positive in scattered  intercalated cells of Caj[place], but is negative in the spindled cells. A CD34  immunostain is positive in blood vessels, but is negative in the spindled cells.  A muscle specific actin immunostain shows staining of many of the spindled  cells. A desmin immunostain is negative in the spindled cells, as is an S-100  immunostain.    This immunophenotype does not favor a gastrointestinal stromal tumor. The  differential  diagnosis includes  hyperplastic polyp, hamartomatous polyp,  inflammatory myofibroblastic tumor, inflammatory fibroid polyp, and plexiform  angiomyxoid myofibroblastic tumor. This case will be sent to the [place] for an outside expert opinion. The outside findings will be reported as  an addendum to this report.    Preliminary findings were discussed with Dr. Rayvon Char by telephone on  08/12/11.    Correlation with the clinical and endoscopic findings is necessary to determine  the significance, if any, of the slight increase in intraepithelial eosinophils  within the esophagus (specimen B).        Clinical History:  44 year old female with dysphagia. EGD showed an approximately 1.5 cm  umbilicated nodule in the gastric antrum.    Gross Description:  Received are two appropriately labeled containers.    Specimen A:    SITE: "pancreatic rest, evaluate"  METHOD: biopsy  MEASURE: 6 x 4 x 2 mm  COMMENT: aggregate of pink/tan tissue fragments  BLOCK #: A1, NTR    Specimen B:    SITE: "random esophagus, rule out eosinophilic esophagitis"  METHOD: biopsy  MEASURE: 5 x 3 x 1 mm  COMMENT: aggregate of multiple white tissue fragments  BLOCK#: B1, NTR    (cb)      P.A.: Romero Belling, PA(ASCP)  gkf//08/06/2011    Light Microscopy:  Light microscopic examination is performed by Dr. Carlota Raspberry.    For cases in which immunostains are performed, the following applies:  Appropriate internal and/or external positive and negative controls have been  evaluated.Some of the immunohistochemical reagents used in this case may be  classified as analyte specific reagents (ASR). These were developed and have  performance characteristics determined by the Anatomic Pathology Department,  [facility].These reagents have not been cleared or  approved by the [place] Food and Drug Administration (FDA).The FDA has determined   that such clearance or approval is not necessary.These tests are used for  clinical purposes.They should not be regarded as investigational or for  research. This laboratory is certified under the [place] (CLIA-88) as qualified to perform high complexity  clinical laboratory testing.      Signature  Attending Pathologist: Nicholes Stairs, MD  abk(08/09/2011)  I have personally conducted the evaluation of the above specimens and have  rendered the above diagnosis(es).    Electronically Signed by:  Doristine Church Carlota Raspberry, MD  Date  08/12/2011    Procedures/Addenda:  Addendum    Addendum  To add the results of an outside expert consultation      Addendum Comment  This case was reviewed by Dr. Antony Salmon ([place]) who made the  following diagnosis and comment:    Stomach, biopsy: Benign mesenchymal polyp. See comment.    Comment:  "I have to begin by saying that I do not have a specific name for this gastric  polyp that was found in a 44 year old woman.As you said in your report, the  bulk of the polyp is composed of spindled mesenchymal cells, some with long  cytoplasmic processes.In one area, the cells are rounded or ovoid with  eosinophilic cytoplasm.I thought of several possibilities, including an  inflammatory fibroid polyp (although the number of eosinophils is fewer than  typically seen), a perineuroma, and others. I reviewed the immunohistochemical  stains that you sent, as well as performing an H & E stain, a trichrome stain,  and an EMA stain in our laboratory.The cells are negative for EMA,  as well as  CD34, S100, desmin, and c-kit.These results essentially exclude a number of  possibilities including a gastrointestinal stromal tumor, a Schwann cell or  neural proliferation, a perineuroma, and an inflammatory fibroid polyp.The  proliferation produces  some collagen as highlighted on the trichrome stain I  performed and the cells have some weak staining for muscle specific actin,  suggesting that they could be myofibroblasts.I am left without a specific name  for this process, although I think it is benign.I also shared this case with  my colleagues, Drs. Sylvester Harder and Joyce Gross, as well as Dr. Ernest Pine, a soft tissue pathologist, and none of them have a specific name either.  Everyone agreed that it was benign."      Electronically Signed: Doristine Church. Carlota Raspberry, MDDate:09/07/2011

## 2019-05-10 ENCOUNTER — Telehealth: Payer: Self-pay | Admitting: Internal Medicine

## 2019-05-10 NOTE — Telephone Encounter (Signed)
Noted. Spoke with pt. Pt notified of LSL/RMR's recommendations and is agreeable to schedule EGD. Please schedule.

## 2019-05-10 NOTE — Telephone Encounter (Signed)
Apt has been scheduled by MB.

## 2019-05-10 NOTE — Telephone Encounter (Signed)
Called pt, EGD w/Prop w/RMR scheduled for 07/23/19 at 2:30pm. Orders entered.

## 2019-05-10 NOTE — Telephone Encounter (Signed)
Please let patient know that Dr. Emerson Monte and I both have reviewed her [facility] records.  He recommended updating EGD for history of gastric nodule, removed at [facility] remotely.  If patient agreeable, schedule EGD with propofol with RMR.  Diagnosis: Gastric nodule previously resected, follow-up

## 2019-05-10 NOTE — Telephone Encounter (Signed)
[phone]  PATIENT CALLED ASKING ABOUT SCHEDULING AN EGD, PLEASE CALL HER AS SOON AS YOU CAN

## 2019-05-11 NOTE — Telephone Encounter (Signed)
Pre-op and COVID test 07/19/19. Letter mailed with procedure instructions.

## 2019-07-18 NOTE — Patient Instructions (Signed)
Gabrielle Scott  07/18/2019     @PREFPERIOPPHARMACY @   Your procedure is scheduled on  07/23/2019 .  Report to [facility] at  1245  P.M.  Call this number if you have problems the morning of surgery:  [phone]   Remember:  Follow the diet instructions given to you by Dr Roseanne Kaufman office.                      Take these medicines the morning of surgery with A SIP OF WATER  Zyrtec, dexilant, ativan(if neded), metoprolol.    Do not wear jewelry, make-up or nail polish.  Do not wear lotions, powders, or perfumes, or deodorant.  Do not shave 48 hours prior to surgery.  Men may shave face and neck.  Do not bring valuables to the hospital.  [facility] is not responsible for any belongings or valuables.  Contacts, dentures or bridgework may not be worn into surgery.  Leave your suitcase in the car.  After surgery it may be brought to your room.  For patients admitted to the hospital, discharge time will be determined by your treatment team.  Patients discharged the day of surgery will not be allowed to drive home.   Name and phone number of your driver:   family Special instructions:  DO NOT smoke the morning of your procedure.  Please read over the following fact sheets that you were given. Anesthesia Post-op Instructions and Care and Recovery After Surgery       Upper Endoscopy, Adult, Care After This sheet gives you information about how to care for yourself after your procedure. Your health care provider may also give you more specific instructions. If you have problems or questions, contact your health care provider. What can I expect after the procedure? After the procedure, it is common to have:  A sore throat.  Mild stomach pain or discomfort.  Bloating.  Nausea. Follow these instructions at home:   Follow instructions from your health care provider about what to eat or drink after your procedure.  Return to your normal activities as told by your  health care provider. Ask your health care provider what activities are safe for you.  Take over-the-counter and prescription medicines only as told by your health care provider.  Do not drive for 24 hours if you were given a sedative during your procedure.  Keep all follow-up visits as told by your health care provider. This is important. Contact a health care provider if you have:  A sore throat that lasts longer than one day.  Trouble swallowing. Get help right away if:  You vomit blood or your vomit looks like coffee grounds.  You have: ? A fever. ? Bloody, black, or tarry stools. ? A severe sore throat or you cannot swallow. ? Difficulty breathing. ? Severe pain in your chest or abdomen. Summary  After the procedure, it is common to have a sore throat, mild stomach discomfort, bloating, and nausea.  Do not drive for 24 hours if you were given a sedative during the procedure.  Follow instructions from your health care provider about what to eat or drink after your procedure.  Return to your normal activities as told by your health care provider. This information is not intended to replace advice given to you by your health care provider. Make sure you discuss any questions you have with your health care provider. Document Revised: 09/06/2017 Document Reviewed: 08/15/2017 Elsevier  Patient Education  2020 [place] After These instructions provide you with information about caring for yourself after your procedure. Your health care provider may also give you more specific instructions. Your treatment has been planned according to current medical practices, but problems sometimes occur. Call your health care provider if you have any problems or questions after your procedure. What can I expect after the procedure? After your procedure, you may:  Feel sleepy for several hours.  Feel clumsy and have poor balance for several hours.  Feel  forgetful about what happened after the procedure.  Have poor judgment for several hours.  Feel nauseous or vomit.  Have a sore throat if you had a breathing tube during the procedure. Follow these instructions at home: For at least 24 hours after the procedure:      Have a responsible adult stay with you. It is important to have someone help care for you until you are awake and alert.  Rest as needed.  Do not: ? Participate in activities in which you could fall or become injured. ? Drive. ? Use heavy machinery. ? Drink alcohol. ? Take sleeping pills or medicines that cause drowsiness. ? Make important decisions or sign legal documents. ? Take care of children on your own. Eating and drinking  Follow the diet that is recommended by your health care provider.  If you vomit, drink water, juice, or soup when you can drink without vomiting.  Make sure you have little or no nausea before eating solid foods. General instructions  Take over-the-counter and prescription medicines only as told by your health care provider.  If you have sleep apnea, surgery and certain medicines can increase your risk for breathing problems. Follow instructions from your health care provider about wearing your sleep device: ? Anytime you are sleeping, including during daytime naps. ? While taking prescription pain medicines, sleeping medicines, or medicines that make you drowsy.  If you smoke, do not smoke without supervision.  Keep all follow-up visits as told by your health care provider. This is important. Contact a health care provider if:  You keep feeling nauseous or you keep vomiting.  You feel light-headed.  You develop a rash.  You have a fever. Get help right away if:  You have trouble breathing. Summary  For several hours after your procedure, you may feel sleepy and have poor judgment.  Have a responsible adult stay with you for at least 24 hours or until you are awake and  alert. This information is not intended to replace advice given to you by your health care provider. Make sure you discuss any questions you have with your health care provider. Document Revised: 06/13/2017 Document Reviewed: 07/06/2015 Elsevier Patient Education  [place].

## 2019-07-19 ENCOUNTER — Encounter (HOSPITAL_COMMUNITY)
Admission: RE | Admit: 2019-07-19 | Discharge: 2019-07-19 | Disposition: A | Discharge status: Home / Self Care | Payer: Medicaid Other | Source: Ambulatory Visit | Attending: Internal Medicine | Admitting: Internal Medicine

## 2019-07-19 ENCOUNTER — Other Ambulatory Visit: Payer: Self-pay

## 2019-07-19 ENCOUNTER — Other Ambulatory Visit (HOSPITAL_COMMUNITY)
Admission: RE | Admit: 2019-07-19 | Discharge: 2019-07-19 | Disposition: A | Discharge status: Home / Self Care | Payer: Medicaid Other | Source: Ambulatory Visit | Attending: Internal Medicine | Admitting: Internal Medicine

## 2019-07-19 ENCOUNTER — Encounter (HOSPITAL_COMMUNITY): Payer: Self-pay | Admitting: Internal Medicine

## 2019-07-19 DIAGNOSIS — Z20822 Contact with and (suspected) exposure to covid-19: Secondary | ICD-10-CM | POA: Diagnosis not present

## 2019-07-19 DIAGNOSIS — Z01812 Encounter for preprocedural laboratory examination: Secondary | ICD-10-CM | POA: Diagnosis not present

## 2019-07-19 LAB — BASIC METABOLIC PANEL
Anion gap: 8 (ref 5–15)
BUN: 8 mg/dL (ref 6–20)
CO2: 27 mmol/L (ref 22–32)
Calcium: 9.1 mg/dL (ref 8.9–10.3)
Chloride: 104 mmol/L (ref 98–111)
Creatinine, Ser: 0.96 mg/dL (ref 0.44–1.00)
GFR calc Af Amer: >60 mL/min (ref >60)
GFR calc non Af Amer: >60 mL/min (ref >60)
Glucose, Bld: 110 mg/dL — ABNORMAL HIGH (ref 70–99)
Potassium: 3.4 mmol/L — ABNORMAL LOW (ref 3.5–5.1)
Sodium: 139 mmol/L (ref 135–145)

## 2019-07-19 LAB — HCG, SERUM, QUALITATIVE: Preg, Serum: NEGATIVE

## 2019-07-20 LAB — SARS CORONAVIRUS 2 (TAT 6-24 HRS): SARS Coronavirus 2: NEGATIVE

## 2019-07-23 ENCOUNTER — Ambulatory Visit (HOSPITAL_COMMUNITY): Payer: Medicaid Other | Admitting: Anesthesiology

## 2019-07-23 ENCOUNTER — Ambulatory Visit (HOSPITAL_COMMUNITY)
Admission: RE | Admit: 2019-07-23 | Discharge: 2019-07-23 | Disposition: A | Discharge status: Home / Self Care | Payer: Medicaid Other | Attending: Internal Medicine | Admitting: Internal Medicine

## 2019-07-23 ENCOUNTER — Encounter (HOSPITAL_COMMUNITY): Payer: Self-pay | Admitting: Internal Medicine

## 2019-07-23 ENCOUNTER — Encounter (HOSPITAL_COMMUNITY)
Admission: RE | Disposition: A | Discharge status: Home / Self Care | Payer: Self-pay | Source: Home / Self Care | Attending: Internal Medicine

## 2019-07-23 DIAGNOSIS — Z8719 Personal history of other diseases of the digestive system: Secondary | ICD-10-CM | POA: Diagnosis not present

## 2019-07-23 DIAGNOSIS — K219 Gastro-esophageal reflux disease without esophagitis: Secondary | ICD-10-CM | POA: Insufficient documentation

## 2019-07-23 DIAGNOSIS — I10 Essential (primary) hypertension: Secondary | ICD-10-CM | POA: Diagnosis not present

## 2019-07-23 DIAGNOSIS — Z793 Long term (current) use of hormonal contraceptives: Secondary | ICD-10-CM | POA: Diagnosis not present

## 2019-07-23 DIAGNOSIS — Z79899 Other long term (current) drug therapy: Secondary | ICD-10-CM | POA: Diagnosis not present

## 2019-07-23 DIAGNOSIS — F259 Schizoaffective disorder, unspecified: Secondary | ICD-10-CM | POA: Insufficient documentation

## 2019-07-23 DIAGNOSIS — Z13810 Encounter for screening for upper gastrointestinal disorder: Secondary | ICD-10-CM | POA: Insufficient documentation

## 2019-07-23 DIAGNOSIS — F419 Anxiety disorder, unspecified: Secondary | ICD-10-CM | POA: Diagnosis not present

## 2019-07-23 DIAGNOSIS — K317 Polyp of stomach and duodenum: Secondary | ICD-10-CM | POA: Diagnosis not present

## 2019-07-23 HISTORY — PX: ESOPHAGOGASTRODUODENOSCOPY (EGD) WITH PROPOFOL: SHX5813

## 2019-07-23 HISTORY — PX: BIOPSY: SHX5522

## 2019-07-23 SURGERY — ESOPHAGOGASTRODUODENOSCOPY (EGD) WITH PROPOFOL
Anesthesia: General

## 2019-07-23 MED ORDER — PROPOFOL 10 MG/ML IV BOLUS
INTRAVENOUS | Status: DC | PRN
  Administered 2019-07-23: 20 mg via INTRAVENOUS

## 2019-07-23 MED ORDER — KETAMINE HCL 50 MG/5ML IJ SOSY
PREFILLED_SYRINGE | INTRAMUSCULAR | Status: AC
  Filled 2019-07-23: qty 5

## 2019-07-23 MED ORDER — LACTATED RINGERS IV SOLN
Freq: Once | INTRAVENOUS | Status: AC
  Administered 2019-07-23: 14:00:00 1000 mL via INTRAVENOUS

## 2019-07-23 MED ORDER — CHLORHEXIDINE GLUCONATE CLOTH 2 % EX PADS: 6.0000 | MEDICATED_PAD | Freq: Once | CUTANEOUS | Status: DC

## 2019-07-23 MED ORDER — LIDOCAINE VISCOUS HCL 2 % MT SOLN
OROMUCOSAL | Status: AC
  Filled 2019-07-23: qty 15

## 2019-07-23 MED ORDER — KETAMINE HCL 10 MG/ML IJ SOLN
INTRAMUSCULAR | Status: DC | PRN
  Administered 2019-07-23: 30 mg via INTRAVENOUS

## 2019-07-23 MED ORDER — LACTATED RINGERS IV SOLN: INTRAVENOUS | Status: DC | PRN

## 2019-07-23 MED ORDER — PROPOFOL 500 MG/50ML IV EMUL
INTRAVENOUS | Status: DC | PRN
  Administered 2019-07-23: 150 ug/kg/min via INTRAVENOUS

## 2019-07-23 MED ORDER — LIDOCAINE VISCOUS HCL 2 % MT SOLN
15.0000 mL | Freq: Once | OROMUCOSAL | Status: AC
  Administered 2019-07-23: 15 mL via OROMUCOSAL

## 2019-07-23 MED ORDER — LIDOCAINE HCL (CARDIAC) PF 100 MG/5ML IV SOSY
PREFILLED_SYRINGE | INTRAVENOUS | Status: DC | PRN
  Administered 2019-07-23: 50 mg via INTRATRACHEAL

## 2019-07-23 NOTE — Transfer of Care (Signed)
Immediate Anesthesia Transfer of Care Note  Patient: Gabrielle Scott  Procedure(s) Performed: ESOPHAGOGASTRODUODENOSCOPY (EGD) WITH PROPOFOL (N/A ) BIOPSY  Patient Location: PACU  Anesthesia Type:General  Level of Consciousness: awake  Airway & Oxygen Therapy: Patient Spontanous Breathing  Post-op Assessment: Report given to RN and Post -op Vital signs reviewed and stable  Post vital signs: Reviewed and stable  Last Vitals:  Vitals Value Taken Time  BP 126/80 07/23/19 1411  Temp    Pulse 89 07/23/19 1414  Resp 27 07/23/19 1414  SpO2 99 % 07/23/19 1414  Vitals shown include unvalidated device data.  Last Pain:  Vitals:   07/23/19 1356  TempSrc:   PainSc: 0-No pain      Patients Stated Pain Goal: 8 ([id number])  Complications: No apparent anesthesia complications

## 2019-07-23 NOTE — Anesthesia Postprocedure Evaluation (Signed)
Anesthesia Post Note  Patient: Gabrielle Scott  Procedure(s) Performed: ESOPHAGOGASTRODUODENOSCOPY (EGD) WITH PROPOFOL (N/A ) BIOPSY  Anesthesia Type: General Level of consciousness: awake Pain management: pain level controlled Vital Signs Assessment: post-procedure vital signs reviewed and stable Respiratory status: spontaneous breathing Cardiovascular status: stable Postop Assessment: no apparent nausea or vomiting Anesthetic complications: no     Last Vitals:  Vitals:   07/23/19 1326 07/23/19 1412  BP: 140/90 126/80  Pulse: 77 89  Resp: 20 19  Temp: 37.2 C (P) 36.5 C  SpO2: 99% 99%    Last Pain:  Vitals:   07/23/19 1356  TempSrc:   PainSc: 0-No pain                 Johnchristopher Sarvis Hristova

## 2019-07-23 NOTE — Op Note (Signed)
[facility] Patient Name: Gabrielle Scott Procedure Date: 07/23/2019 1:49 PM MRN: [id number] Date of Birth: [birth year] Attending MD: Norvel Richards , MD CSN: [id number] Age: 44 Admit Type: Outpatient Procedure:                Upper GI endoscopy Indications:              Surveillance procedure Providers:                Norvel Richards, MD, Otis Peak B. Sharon Seller, RN,                            Randa Spike, Technician Referring MD:              Medicines:                Propofol per Anesthesia Complications:            No immediate complications. Estimated Blood Loss:     Estimated blood loss was minimal. Estimated blood                            loss was minimal. Procedure:                Pre-Anesthesia Assessment:                           - Prior to the procedure, a History and Physical                            was performed, and patient medications and                            allergies were reviewed. The patient's tolerance of                            previous anesthesia was also reviewed. The risks                            and benefits of the procedure and the sedation                            options and risks were discussed with the patient.                            All questions were answered, and informed consent                            was obtained. Prior Anticoagulants: The patient has                            taken no previous anticoagulant or antiplatelet                            agents. ASA Grade Assessment: II - A patient with  mild systemic disease. After reviewing the risks                            and benefits, the patient was deemed in                            satisfactory condition to undergo the procedure.                           After obtaining informed consent, the endoscope was                            passed under direct vision. Throughout the                            procedure, the patient's  blood pressure, pulse, and                            oxygen saturations were monitored continuously. The                            [id number]) scope was introduced through the                            mouth, and advanced to the second part of duodenum. Scope In: 1:59:59 PM Scope Out: 2:05:36 PM Total Procedure Duration: 0 hours 5 minutes 37 seconds  Findings:      The examined esophagus was normal.      Multiple gastric polyps ranging from 2 mm to 7 mm. No ulcer or       infiltrating process seen. The 2 largest polyps (7 mm) were resected       with a cold biopsy forcep. No complications.      The duodenal bulb and second portion of the duodenum were normal. Impression:               - Normal esophagus.                           - Multiple gastric polyps. 2 largest polyps removed                           - Normal duodenal bulb and second portion of the                            duodenum.                           - No specimens collected. Moderate Sedation:      Moderate (conscious) sedation was personally administered by an       anesthesia professional. The following parameters were monitored: oxygen       saturation, heart rate, blood pressure, respiratory rate, EKG, adequacy       of pulmonary ventilation, and response to care. Recommendation:           - Patient has a contact number available for  emergencies. The signs and symptoms of potential                            delayed complications were discussed with the                            patient. Return to normal activities tomorrow.                            Written discharge instructions were provided to the                            patient.                           - Resume previous diet.                           - Continue present medications.                           - Await pathology results.                           -Further recommendations to follow. Procedure Code(s):         --- Professional ---                           [phone], Esophagogastroduodenoscopy, flexible,                            transoral; diagnostic, including collection of                            specimen(s) by brushing or washing, when performed                            (separate procedure) Diagnosis Code(s):        --- Professional ---                           K31.7, Polyp of stomach and duodenum CPT copyright 2019 American Medical Association. All rights reserved. The codes documented in this report are preliminary and upon coder review may  be revised to meet current compliance requirements. Cristopher Estimable. Falesha Schommer, MD Norvel Richards, MD 07/23/2019 2:12:49 PM This report has been signed electronically. Number of Addenda: 0

## 2019-07-23 NOTE — Anesthesia Preprocedure Evaluation (Signed)
Anesthesia Evaluation  Patient identified by MRN, date of birth, ID band Patient awake    Reviewed: Allergy & Precautions, NPO status , Patient's Chart, lab work & pertinent test results, reviewed documented beta blocker date and time   History of Anesthesia Complications Negative for: history of anesthetic complications  Airway Mallampati: III  TM Distance: >3 FB Neck ROM: Full    Dental  (+) Dental Advisory Given, Teeth Intact   Pulmonary neg pulmonary ROS,    breath sounds clear to auscultation       Cardiovascular Exercise Tolerance: Good hypertension, Pt. on medications and Pt. on home beta blockers  Rhythm:Regular Rate:Normal     Neuro/Psych PSYCHIATRIC DISORDERS Anxiety Schizophrenia negative neurological ROS     GI/Hepatic Neg liver ROS, GERD  Medicated and Controlled,  Endo/Other  negative endocrine ROS  Renal/GU negative Renal ROS     Musculoskeletal negative musculoskeletal ROS (+)   Abdominal   Peds  Hematology negative hematology ROS (+)   Anesthesia Other Findings   Reproductive/Obstetrics                            Anesthesia Physical Anesthesia Plan  ASA: II  Anesthesia Plan: General   Post-op Pain Management:    Induction: Intravenous  PONV Risk Score and Plan:   Airway Management Planned: Nasal Cannula, Natural Airway and Simple Face Mask  Additional Equipment:   Intra-op Plan:   Post-operative Plan:   Informed Consent: I have reviewed the patients History and Physical, chart, labs and discussed the procedure including the risks, benefits and alternatives for the proposed anesthesia with the patient or authorized representative who has indicated his/her understanding and acceptance.     Dental advisory given  Plan Discussed with: CRNA and Surgeon  Anesthesia Plan Comments:         Anesthesia Quick Evaluation

## 2019-07-23 NOTE — Discharge Instructions (Signed)
EGD Discharge instructions Please read the instructions outlined below and refer to this sheet in the next few weeks. These discharge instructions provide you with general information on caring for yourself after you leave the hospital. Your doctor may also give you specific instructions. While your treatment has been planned according to the most current medical practices available, unavoidable complications occasionally occur. If you have any problems or questions after discharge, please call your doctor. ACTIVITY  You may resume your regular activity but move at a slower pace for the next 24 hours.   Take frequent rest periods for the next 24 hours.   Walking will help expel (get rid of) the air and reduce the bloated feeling in your abdomen.   No driving for 24 hours (because of the anesthesia (medicine) used during the test).   You may shower.   Do not sign any important legal documents or operate any machinery for 24 hours (because of the anesthesia used during the test).  NUTRITION  Drink plenty of fluids.   You may resume your normal diet.   Begin with a light meal and progress to your normal diet.   Avoid alcoholic beverages for 24 hours or as instructed by your caregiver.  MEDICATIONS  You may resume your normal medications unless your caregiver tells you otherwise.  WHAT YOU CAN EXPECT TODAY  You may experience abdominal discomfort such as a feeling of fullness or "gas" pains.  FOLLOW-UP  Your doctor will discuss the results of your test with you.  SEEK IMMEDIATE MEDICAL ATTENTION IF ANY OF THE FOLLOWING OCCUR:  Excessive nausea (feeling sick to your stomach) and/or vomiting.   Severe abdominal pain and distention (swelling).   Trouble swallowing.   Temperature over 101 F (37.8 C).   Rectal bleeding or vomiting of blood.   2 polyps removed from your colon today  Further recommendations to follow pending review of pathology report  At patient request, I  called her mother Blair Promise at [phone] -got voicemail     Colon Polyps  Polyps are tissue growths inside the body. Polyps can grow in many places, including the large intestine (colon). A polyp may be a round bump or a mushroom-shaped growth. You could have one polyp or several. Most colon polyps are noncancerous (benign). However, some colon polyps can become cancerous over time. Finding and removing the polyps early can help prevent this. What are the causes? The exact cause of colon polyps is not known. What increases the risk? You are more likely to develop this condition if you:  Have a family history of colon cancer or colon polyps.  Are older than 10 or older than 45 if you are African American.  Have inflammatory bowel disease, such as ulcerative colitis or Crohn's disease.  Have certain hereditary conditions, such as: ? Familial adenomatous polyposis. ? Lynch syndrome. ? [place] syndrome. ? Peutz-Jeghers syndrome.  Are overweight.  Smoke cigarettes.  Do not get enough exercise.  Drink too much alcohol.  Eat a diet that is high in fat and red meat and low in fiber.  Had childhood cancer that was treated with abdominal radiation. What are the signs or symptoms? Most polyps do not cause symptoms. If you have symptoms, they may include:  Blood coming from your rectum when having a bowel movement.  Blood in your stool. The stool may look dark red or black.  Abdominal pain.  A change in bowel habits, such as constipation or diarrhea. How is this diagnosed?  This condition is diagnosed with a colonoscopy. This is a procedure in which a lighted, flexible scope is inserted into the anus and then passed into the colon to examine the area. Polyps are sometimes found when a colonoscopy is done as part of routine cancer screening tests. How is this treated? Treatment for this condition involves removing any polyps that are found. Most polyps can be removed during a  colonoscopy. Those polyps will then be tested for cancer. Additional treatment may be needed depending on the results of testing. Follow these instructions at home: Lifestyle  Maintain a healthy weight, or lose weight if recommended by your health care provider.  Exercise every day or as told by your health care provider.  Do not use any products that contain nicotine or tobacco, such as cigarettes and e-cigarettes. If you need help quitting, ask your health care provider.  If you drink alcohol, limit how much you have: ? 0-1 drink a day for women. ? 0-2 drinks a day for men.  Be aware of how much alcohol is in your drink. In the [place] one drink equals one 12 oz bottle of beer (355 mL), one 5 oz glass of wine (148 mL), or one 1 oz shot of hard liquor (44 mL). Eating and drinking   Eat foods that are high in fiber, such as fruits, vegetables, and whole grains.  Eat foods that are high in calcium and vitamin D, such as milk, cheese, yogurt, eggs, liver, fish, and broccoli.  Limit foods that are high in fat, such as fried foods and desserts.  Limit the amount of red meat and processed meat you eat, such as hot dogs, sausage, bacon, and lunch meats. General instructions  Keep all follow-up visits as told by your health care provider. This is important. ? This includes having regularly scheduled colonoscopies. ? Talk to your health care provider about when you need a colonoscopy. Contact a health care provider if:  You have new or worsening bleeding during a bowel movement.  You have new or increased blood in your stool.  You have a change in bowel habits.  You lose weight for no known reason. Summary  Polyps are tissue growths inside the body. Polyps can grow in many places, including the colon.  Most colon polyps are noncancerous (benign), but some can become cancerous over time.  This condition is diagnosed with a colonoscopy.  Treatment for this condition involves  removing any polyps that are found. Most polyps can be removed during a colonoscopy. This information is not intended to replace advice given to you by your health care provider. Make sure you discuss any questions you have with your health care provider. Document Revised: 06/30/2017 Document Reviewed: 06/30/2017 Elsevier Patient Education  [place] These instructions provide you with information about caring for yourself after your procedure. Your health care provider may also give you more specific instructions. Your treatment has been planned according to current medical practices, but problems sometimes occur. Call your health care provider if you have any problems or questions after your procedure. What can I expect after the procedure? After your procedure, you may:  Feel sleepy for several hours.  Feel clumsy and have poor balance for several hours.  Feel forgetful about what happened after the procedure.  Have poor judgment for several hours.  Feel nauseous or vomit.  Have a sore throat if you had a breathing tube during the procedure.  Follow these instructions at home: For at least 24 hours after the procedure:      Have a responsible adult stay with you. It is important to have someone help care for you until you are awake and alert.  Rest as needed.  Do not: ? Participate in activities in which you could fall or become injured. ? Drive. ? Use heavy machinery. ? Drink alcohol. ? Take sleeping pills or medicines that cause drowsiness. ? Make important decisions or sign legal documents. ? Take care of children on your own. Eating and drinking  Follow the diet that is recommended by your health care provider.  If you vomit, drink water, juice, or soup when you can drink without vomiting.  Make sure you have little or no nausea before eating solid foods. General instructions  Take over-the-counter and  prescription medicines only as told by your health care provider.  If you have sleep apnea, surgery and certain medicines can increase your risk for breathing problems. Follow instructions from your health care provider about wearing your sleep device: ? Anytime you are sleeping, including during daytime naps. ? While taking prescription pain medicines, sleeping medicines, or medicines that make you drowsy.  If you smoke, do not smoke without supervision.  Keep all follow-up visits as told by your health care provider. This is important. Contact a health care provider if:  You keep feeling nauseous or you keep vomiting.  You feel light-headed.  You develop a rash.  You have a fever. Get help right away if:  You have trouble breathing. Summary  For several hours after your procedure, you may feel sleepy and have poor judgment.  Have a responsible adult stay with you for at least 24 hours or until you are awake and alert. This information is not intended to replace advice given to you by your health care provider. Make sure you discuss any questions you have with your health care provider. Document Revised: 06/13/2017 Document Reviewed: 07/06/2015 Elsevier Patient Education  [place].

## 2019-07-23 NOTE — H&P (Signed)
@LOGO @   Primary Care Physician:  [facility] Primary Gastroenterologist:  Dr. Gala Romney  Pre-Procedure History & Physical: HPI:  Gabrielle Scott is a 44 y.o. female here for surveillance.  Benign gastric nodule removed 2013.  Follow-up recommended.  Not done until now.  No upper GI tract symptoms.  Here for surveillance EGD  Past Medical History:  Diagnosis Date  . Anal fissure   . Anxiety   . Benign neoplasm of stomach   . Constipation   . Elevated serum creatinine   . Folliculitis   . GERD (gastroesophageal reflux disease)   . Hemorrhoids   . Hypertension   . Overweight   . Palpitations   . Prediabetes   . Schizoaffective disorder ([place])   . Tachycardia   . White coat syndrome with diagnosis of hypertension     Past Surgical History:  Procedure Laterality Date  . BREAST LUMPECTOMY Left    Cannot remember date; non-cancerous  . COLONOSCOPY WITH PROPOFOL N/A 06/09/2017   Procedure: COLONOSCOPY WITH PROPOFOL;  Surgeon: Daneil Dolin, MD;  Location: [facility];  Service: Endoscopy;  Laterality: N/A;  1:30pm  . gastric polypectomy  2013   [facility]  . WISDOM TOOTH EXTRACTION      Prior to Admission medications   Medication Sig Start Date End Date Taking? Authorizing Provider  CALCIUM 600/VITAMIN D3 600-800 MG-UNIT TABS Take 1 tablet by mouth in the morning and at bedtime. 05/22/19  Yes [provider]  dexlansoprazole (DEXILANT) 60 MG capsule Take 1 capsule (60 mg total) by mouth daily before breakfast. 03/07/19  Yes Mahala Menghini, PA-C  hydrocortisone (ANUSOL-HC) 2.5 % rectal cream Place 1 application rectally 2 (two) times daily as needed for hemorrhoids. 05/24/19  Yes [provider]  LORazepam (ATIVAN) 0.5 MG tablet Take 0.5 mg by mouth 2 (two) times daily as needed for anxiety.    Yes [provider]  losartan-hydrochlorothiazide (HYZAAR) 50-12.5 MG tablet Take 1 tablet by mouth daily.   Yes [provider]  metoprolol  succinate (TOPROL-XL) 100 MG 24 hr tablet Take 100 mg by mouth daily. Take with or immediately following a meal.   Yes [provider]  QUEtiapine (SEROQUEL) 25 MG tablet Take 25 mg by mouth at bedtime.   Yes [provider]  RISPERDAL CONSTA 50 MG injection Inject 50 mg into the muscle every 14 (fourteen) days. 06/25/19  Yes [provider]  cetirizine (ZYRTEC) 10 MG tablet Take 10 mg by mouth daily.     [provider]  MedroxyPROGESTERone Acetate (DEPO-PROVERA IM) Inject into the muscle every 3 (three) months.    [provider]    Allergies as of 05/10/2019  . (No Known Allergies)    Family History  Problem Relation Age of Onset  . Colon cancer Neg Hx   . Gastric cancer Neg Hx   . Esophageal cancer Neg Hx     Social History   Socioeconomic History  . Marital status: Single    Spouse name: Not on file  . Number of children: Not on file  . Years of education: Not on file  . Highest education level: Not on file  Occupational History  . Not on file  Tobacco Use  . Smoking status: Never Smoker  . Smokeless tobacco: Never Used  Substance and Sexual Activity  . Alcohol use: No  . Drug use: No  . Sexual activity: Yes    Birth control/protection: Injection  Other Topics Concern  .  Not on file  Social History Narrative  . Not on file   Social Determinants of Health   Financial Resource Strain:   . Difficulty of Paying Living Expenses:   Food Insecurity:   . Worried About Charity fundraiser in the Last Year:   . Arboriculturist in the Last Year:   Transportation Needs:   . Film/video editor (Medical):   Marland Kitchen Lack of Transportation (Non-Medical):   Physical Activity:   . Days of Exercise per Week:   . Minutes of Exercise per Session:   Stress:   . Feeling of Stress :   Social Connections:   . Frequency of Communication with Friends and Family:   . Frequency of Social Gatherings with Friends and Family:   . Attends  Religious Services:   . Active Member of Clubs or Organizations:   . Attends Archivist Meetings:   Marland Kitchen Marital Status:   Intimate Partner Violence:   . Fear of Current or Ex-Partner:   . Emotionally Abused:   Marland Kitchen Physically Abused:   . Sexually Abused:     Review of Systems: See HPI, otherwise negative ROS  Physical Exam: BP 140/90   Pulse 77   Temp 98.9 F (37.2 C) (Oral)   Resp 20   SpO2 99%  General:   Alert,  Well-developed, well-nourished, pleasant and cooperative in NAD Neck:  Supple; no masses or thyromegaly. No significant cervical adenopathy. Lungs:  Clear throughout to auscultation.   No wheezes, crackles, or rhonchi. No acute distress. Heart:  Regular rate and rhythm; no murmurs, clicks, rubs,  or gallops. Abdomen: Non-distended, normal bowel sounds.  Soft and nontender without appreciable mass or hepatosplenomegaly.  Pulses:  Normal pulses noted. Extremities:  Without clubbing or edema.  Impression/Plan: 44 year old lady here for surveillance EGD.  Gastric nodule removed 2013.  No upper GI tract symptoms currently. The risks, benefits, limitations, alternatives and imponderables have been reviewed with the patient. Potential for esophageal dilation, biopsy, etc. have also been reviewed.  Questions have been answered. All parties agreeable.     Notice: This dictation was prepared with Dragon dictation along with smaller phrase technology. Any transcriptional errors that result from this process are unintentional and may not be corrected upon review.

## 2019-07-25 ENCOUNTER — Encounter: Payer: Self-pay | Admitting: Internal Medicine

## 2019-07-25 LAB — SURGICAL PATHOLOGY

## 2019-09-26 ENCOUNTER — Telehealth: Payer: Self-pay | Admitting: Internal Medicine

## 2019-09-26 NOTE — Telephone Encounter (Signed)
PA was completed with [employer] [facility]). Dexilant 60 mg will be approved through 09/20/2020. PA approval nmber is [id number]. Left a detailed message for [facility]. Pt is aware of the approval and approval letter will be scanned in chart.

## 2019-09-26 NOTE — Telephone Encounter (Signed)
Pt said the pharmacy wouldn't give her her pills. She said that she was out of dexilant and [employer] told her we needed to authorize her pills. Please advise. [phone]

## 2019-12-26 ENCOUNTER — Other Ambulatory Visit: Payer: Self-pay | Admitting: Gastroenterology

## 2019-12-26 DIAGNOSIS — K219 Gastro-esophageal reflux disease without esophagitis: Secondary | ICD-10-CM

## 2020-02-15 ENCOUNTER — Other Ambulatory Visit (HOSPITAL_COMMUNITY): Payer: Self-pay | Admitting: Student

## 2020-02-15 DIAGNOSIS — Z1231 Encounter for screening mammogram for malignant neoplasm of breast: Secondary | ICD-10-CM

## 2020-03-03 ENCOUNTER — Ambulatory Visit (HOSPITAL_COMMUNITY)
Admission: RE | Admit: 2020-03-03 | Discharge: 2020-03-03 | Disposition: A | Discharge status: Home / Self Care | Payer: Medicaid Other | Source: Ambulatory Visit | Attending: Student | Admitting: Student

## 2020-03-03 ENCOUNTER — Other Ambulatory Visit: Payer: Self-pay

## 2020-03-03 DIAGNOSIS — Z1231 Encounter for screening mammogram for malignant neoplasm of breast: Secondary | ICD-10-CM | POA: Insufficient documentation

## 2020-06-25 ENCOUNTER — Encounter: Payer: Self-pay | Admitting: Internal Medicine

## 2020-09-24 ENCOUNTER — Telehealth: Payer: Self-pay

## 2020-09-24 NOTE — Telephone Encounter (Signed)
error 

## 2020-09-24 NOTE — Telephone Encounter (Signed)
Filled out a standard drug request form for [place] Medicaid and [place] [employer] for pt's Dexilant. (Do not have access to portal for [employer]). Waiting on a response

## 2020-09-24 NOTE — Telephone Encounter (Signed)
Called [place] [employer] after going through props and being on hold for 11 minutes the phone disconnected. Will try later.

## 2020-09-24 NOTE — Telephone Encounter (Signed)
Pt called inquiring of her Dexilant being approved. I advised the pt its going to take more than a day since we have no recent notes on her. Her last ov was in 2020. I advised the pt once approved/not approved I will advise her.

## 2020-09-30 ENCOUNTER — Telehealth: Payer: Self-pay | Admitting: Internal Medicine

## 2020-09-30 NOTE — Telephone Encounter (Signed)
Patient called again and said that she is still waiting to hear about her Linz[place] prescription, stated she called last week

## 2020-09-30 NOTE — Telephone Encounter (Signed)
Spoke to pt.  She is out of Dexilant.  Last seen 02/2019.  Pt says she doesn't need Linzess.  IBS under control but needs Dexilant.  Has ov 10/22/2020.  Wants to know what she can do at home to help until her appt. Pt made aware that Coralyn Helling is working on PA for her.

## 2020-10-01 ENCOUNTER — Telehealth: Payer: Self-pay | Admitting: Internal Medicine

## 2020-10-01 NOTE — Telephone Encounter (Signed)
Spoke to pt.  Made her aware that she could purchase lansoprazole 15 mg OTC and take one daily before breakfast.  Pt voiced understanding.

## 2020-10-01 NOTE — Telephone Encounter (Signed)
Pt needs to speak with nurse. [phone]

## 2020-10-01 NOTE — Telephone Encounter (Signed)
She can purchase lansoprazole 15mg  OTC and take one daily before breakfast.

## 2020-10-01 NOTE — Telephone Encounter (Signed)
Spoke to pt.  She informed me that she received denial letter for [place].  Pt was reminded that we spoke yesterday about [name] in the process of working on PA for her.  Pt voiced understanding.

## 2020-10-02 NOTE — Telephone Encounter (Signed)
This pt has [place] and I haven't been put in the system as authorized user yet.

## 2020-10-09 NOTE — Telephone Encounter (Signed)
noted 

## 2020-10-09 NOTE — Telephone Encounter (Signed)
FYI: pt hasn't been seen since 03/07/2019. She expressed understanding that she will need a office visit

## 2020-10-09 NOTE — Telephone Encounter (Signed)
Patient has upcoming ov 10/22/20.   I can send in a different PPI that is on Medicaid formulary if she would like.   I would recommend lansoprazole, omeprazole, or pantoprazole. Any are fine, let me know.

## 2020-10-09 NOTE — Telephone Encounter (Signed)
FYI: Phoned and advised the pt that Dexilant was denied and they would not cover it. She advised me that she has had some issues with Medicaid. I advised the pt once she clears that up to call for an office visit so she can be evaluated and we can possibly try again, but she will need office visit.

## 2020-10-10 MED ORDER — PANTOPRAZOLE SODIUM 40 MG PO TBEC
40.0000 mg | DELAYED_RELEASE_TABLET | Freq: Every day | ORAL | 0 refills | Status: DC
Start: 2020-10-10 — End: 2021-07-01

## 2020-10-10 NOTE — Telephone Encounter (Signed)
Phoned and spoke with the pt advised of her upcoming visit and that Neil Crouch, PA-C can send in a different medication that is on Medicaid's formulary list. The pt states she will try Pantoprazole, because the Omeprazole makes her abdomen hurt.

## 2020-10-10 NOTE — Telephone Encounter (Signed)
Pt advised of Rx being sent to her pharmacy

## 2020-10-10 NOTE — Telephone Encounter (Signed)
RX sent for pantoprazole.

## 2020-10-10 NOTE — Addendum Note (Signed)
Addended by: Mahala Menghini on: 10/10/2020 11:34 AM   Modules accepted: Orders

## 2020-10-22 ENCOUNTER — Ambulatory Visit (INDEPENDENT_AMBULATORY_CARE_PROVIDER_SITE_OTHER): Payer: Medicaid Other | Admitting: Gastroenterology

## 2020-10-22 ENCOUNTER — Other Ambulatory Visit: Payer: Self-pay

## 2020-10-22 ENCOUNTER — Encounter: Payer: Self-pay | Admitting: Gastroenterology

## 2020-10-22 VITALS — BP 128/87 | HR 76 | Temp 98.0°F | Ht 67.0 in | Wt 157.2 lb

## 2020-10-22 DIAGNOSIS — K219 Gastro-esophageal reflux disease without esophagitis: Secondary | ICD-10-CM | POA: Diagnosis not present

## 2020-10-22 MED ORDER — DEXLANSOPRAZOLE 60 MG PO CPDR: 60.0000 mg | DELAYED_RELEASE_CAPSULE | Freq: Every day | ORAL | 11 refills | Status: DC

## 2020-10-22 NOTE — Progress Notes (Signed)
Primary Care Physician: [facility], [facility] [place]  Primary Gastroenterologist:  Garfield Cornea, MD   Chief Complaint  Patient presents with   Gastroesophageal Reflux    HPI: Gabrielle Scott is a 45 y.o. female here for follow-up of GERD.  Last seen in December 2020.    Recently switched to pantoprazole 40 mg daily because Dexilant was denied. Patient has been off [place] for over a month.  States her reflux is well controlled while she was on the medication.  Previously did not tolerate omeprazole because it made her abdomen deferred.  Started pantoprazole 2 weeks ago, still with frequent heartburn, nausea but no vomiting.  Denies abdominal pain, dysphagia, constipation, diarrhea, melena, rectal bleeding.  No unintentional weight loss.  EGD April 2021: -Multiple gastric polyps ranging from 2 mm to 7 mm. No ulcer or infiltrating process seen. The 2 largest polyps (7 mm) were resected with a cold biopsy forcep. No complications. Path-fundic gland polyp. -The duodenal bulb and second portion of the duodenum were normal. -Normal esophagus. -Multiple gastric polyps. 2 largest polyps removed -Normal duodenal bulb and second portion  Colonoscopy in March 2019, internal hemorrhoids.  Next colonoscopy in 10 years.    Current Outpatient Medications  Medication Sig Dispense Refill   CALCIUM 600/VITAMIN D3 600-800 MG-UNIT TABS Take 1 tablet by mouth in the morning and at bedtime.     cetirizine (ZYRTEC) 10 MG tablet Take 10 mg by mouth daily.     hydrocortisone (ANUSOL-HC) 2.5 % rectal cream Place 1 application rectally as needed for hemorrhoids.     LORazepam (ATIVAN) 0.5 MG tablet Take 0.5 mg by mouth 2 (two) times daily as needed for anxiety.      losartan-hydrochlorothiazide (HYZAAR) 50-12.5 MG tablet Take 1 tablet by mouth daily.     MedroxyPROGESTERone Acetate (DEPO-PROVERA IM) Inject into the muscle every 3 (three) months.     metoprolol succinate (TOPROL-XL) 100 MG 24 hr  tablet Take 100 mg by mouth daily. Take with or immediately following a meal.     pantoprazole (PROTONIX) 40 MG tablet Take 1 tablet (40 mg total) by mouth daily before breakfast. 90 tablet 0   QUEtiapine (SEROQUEL) 25 MG tablet Take 25 mg by mouth at bedtime.     RISPERDAL CONSTA 50 MG injection Inject 50 mg into the muscle every 14 (fourteen) days.     No current facility-administered medications for this visit.    Allergies as of 10/22/2020   (No Known Allergies)    ROS:  General: Negative for anorexia, weight loss, fever, chills, fatigue, weakness. ENT: Negative for hoarseness, difficulty swallowing , nasal congestion. CV: Negative for chest pain, angina, palpitations, dyspnea on exertion, peripheral edema.  Respiratory: Negative for dyspnea at rest, dyspnea on exertion, cough, sputum, wheezing.  GI: See history of present illness. GU:  Negative for dysuria, hematuria, urinary incontinence, urinary frequency, nocturnal urination.  Endo: Negative for unusual weight change.    Physical Examination:   BP 128/87   Pulse 76   Temp 98 F (36.7 C) (Temporal)   Ht '5\' 7"'$  (1.702 m)   Wt 157 lb 3.2 oz (71.3 kg)   BMI 24.62 kg/m   General: Well-nourished, well-developed in no acute distress.  Eyes: No icterus. Mouth: masked Lungs: Clear to auscultation bilaterally.  Heart: Regular rate and rhythm, no murmurs rubs or gallops.  Abdomen: Bowel sounds are normal, nontender, nondistended, no hepatosplenomegaly or masses, no abdominal bruits or hernia , no rebound or guarding.  Extremities: No lower extremity edema. No clubbing or deformities. Neuro: Alert and oriented x 4   Skin: Warm and dry, no jaundice.   Psych: Alert and cooperative, normal mood and affect.      Imaging Studies: No results found.   Assessment/plan:  GERD: Poorly controlled.  She was doing well on Dexilant but has been off medication for 1 month due to insurance not covering.  Started on pantoprazole 40 mg  daily but is having frequent heartburn and nausea.  We will see if insurance will cover generic Dexilant 60 mg daily.  If not we will increase her pantoprazole to 40 mg twice daily before meals for the next 1 month with goal of going back to once daily thereafter.  Reinforced antireflux measures.  The office in 1 year or sooner if needed.

## 2020-10-22 NOTE — Patient Instructions (Signed)
I have sent in generic Dexilant to pharmacy to see if Medicaid will cover. If you are able to get medication, then you will need to stop pantoprazole. If you cannot get generic Dexilant at the pharmacy, please increase pantoprazole to '40mg'$  twice daily before breakfast and supper for the next one month, then go back to once daily. Call if your reflux does not settle back down. Return to the office in one year or sooner if needed.

## 2020-10-23 ENCOUNTER — Telehealth: Payer: Self-pay

## 2020-10-23 NOTE — Telephone Encounter (Signed)
Faxed PA for the pt to have Dexlansoprazole 60 mg  to [facility]. Waiting on a response. Had advised the pt yesterday in office that I am having to do them by fax so for her to wait a couple of days to call the pharmacy. Advised her if the pharmacy has not gotten authorization to call me so I can contact [employer] [employer]. She agreed.

## 2020-12-25 ENCOUNTER — Telehealth: Payer: Self-pay | Admitting: Gastroenterology

## 2020-12-25 NOTE — Telephone Encounter (Signed)
PA for Dexlansoprazole 60 mg tablets has been approved. Approval letter will be scanned into patient's chart.

## 2021-02-24 ENCOUNTER — Telehealth: Payer: Self-pay

## 2021-02-24 NOTE — Telephone Encounter (Signed)
Pt had phoned wanting to schedule appt with Neil Crouch when you get time.  thanks

## 2021-06-23 ENCOUNTER — Other Ambulatory Visit (HOSPITAL_COMMUNITY): Payer: Self-pay | Admitting: Family Medicine

## 2021-06-23 DIAGNOSIS — Z1231 Encounter for screening mammogram for malignant neoplasm of breast: Secondary | ICD-10-CM

## 2021-06-29 NOTE — Progress Notes (Signed)
? ? ?Referring Provider: [place], [place] Phys[place]:  Inc, [place] ?Primary GI Physician: Dr. Gala Romney ? ?Chief Complaint  ?Patient presents with  ? Diarrhea  ? ? ?HPI:   ?Gabrielle Scott is a 46 y.o. female with history of GERD, constipation, and hemorrhoids, presenting today for follow-up with chief complaint of diarrhea.  ? ?Last colonoscopy March 2019 for hematochezia with internal hemorrhoids.  Recommended repeat in 10 years. ? ?EGD in April 2021 with multiple gastric polyps ranging from 2 to 7 mm.  2 largest polyps were resected and revealed fundic gland polyp on pathology.  Otherwise, exam was normal. ? ?Last seen in our office 10/22/2020 for GERD.  She had switched to pantoprazole 40 mg daily as Dexilant was not covered.  Previously, Dexilant had kept reflux well controlled.  She did not tolerate omeprazole. ?Been on Protonix for 2 weeks, still having frequent heartburn, nausea without vomiting.  Plan to see if insurance would cover generic Dexilant, if not would increase Protonix to twice daily x1 month with goal of decreasing back to once daily thereafter. ? ?Ultimately, PA for Dexilant was approved. ? ?Today:  ? ?Diarrhea:  ?BMs after everything she eats. Avoids dairy. Diarrhea has been present for a couple of years. Takes imodium- 1 cup full as needed. Helps sometimes. Up to 11-12 BMs daily. No nocturnal stools. Occasionally will see light blood on toilet tissue. No black stool. No rectal pain. No evaluation has been completed thus far by PCP. No new medications.  No fever or chills.  No family history of colon cancer or IBD. ? ?Occasional abdominal cramping prior to BMs that improves thereafter.  ? ?Down 7 lbs since July 2022. Reports weight fluctuates.  ? ?GERD:  ?Well controlled on Dexilant 60 mg daily.. No nausea, vomiting, dysphagia. ? ? ?Past Medical History:  ?Diagnosis Date  ? Anal fissure   ? Anxiety   ? Benign neoplasm of stomach   ? Constipation   ? Elevated  serum creatinine   ? Folliculitis   ? GERD (gastroesophageal reflux disease)   ? Hemorrhoids   ? Hypertension   ? Overweight   ? Palpitations   ? Prediabetes   ? Schizoaffective disorder ([place])   ? Tachycardia   ? White coat syndrome with diagnosis of hypertension   ? ? ?Past Surgical History:  ?Procedure Laterality Date  ? BIOPSY  07/23/2019  ? Procedure: BIOPSY;  Surgeon: Daneil Dolin, MD;  [facility];  Service: Endoscopy;;  gastric polyps  ? BREAST LUMPECTOMY Left   ? Cannot remember date; non-cancerous  ? COLONOSCOPY WITH PROPOFOL N/A 06/09/2017  ? Surgeon: Daneil Dolin, MD; internal hemorrhoids.  Recommended repeat in 10 years.  ? ESOPHAGOGASTRODUODENOSCOPY (EGD) WITH PROPOFOL N/A 07/23/2019  ? Procedure: ESOPHAGOGASTRODUODENOSCOPY (EGD) WITH PROPOFOL;  Surgeon: Daneil Dolin, MD;  [facility];  Service: Endoscopy;  Laterality: N/A;  2:30pm  ? gastric polypectomy  2013  ? [facility]  ? WISDOM TOOTH EXTRACTION    ? ? ?Current Outpatient Medications  ?Medication Sig Dispense Refill  ? CALCIUM 600/VITAMIN D3 600-800 MG-UNIT TABS Take 1 tablet by mouth in the morning and at bedtime.    ? cetirizine (ZYRTEC) 10 MG tablet Take 10 mg by mouth daily.    ? dexlansoprazole (DEXILANT) 60 MG capsule Take 1 capsule (60 mg total) by mouth daily before breakfast. 30 capsule 11  ? hydrocortisone (ANUSOL-HC) 2.5 % rectal cream Place 1 application rectally as needed for  hemorrhoids.    ? LORazepam (ATIVAN) 0.5 MG tablet Take 0.5 mg by mouth 2 (two) times daily as needed for anxiety.     ? losartan-hydrochlorothiazide (HYZAAR) 50-12.5 MG tablet Take 1 tablet by mouth daily.    ? MedroxyPROGESTERone Acetate (DEPO-PROVERA IM) Inject into the muscle every 3 (three) months.    ? metoprolol succinate (TOPROL-XL) 100 MG 24 hr tablet Take 100 mg by mouth daily. Take with or immediately following a meal.    ? QUEtiapine (SEROQUEL) 25 MG tablet Take 25 mg by mouth at bedtime.    ? RISPERDAL CONSTA 50 MG injection  Inject 50 mg into the muscle every 14 (fourteen) days.    ? ?No current facility-administered medications for this visit.  ? ? ?Allergies as of 07/01/2021  ? (No Known Allergies)  ? ? ?Family History  ?Problem Relation Age of Onset  ? Colon cancer Neg Hx   ? Gastric cancer Neg Hx   ? Esophageal cancer Neg Hx   ? Inflammatory bowel disease Neg Hx   ? ? ?Social History  ? ?Socioeconomic History  ? Marital status: Single  ?  Spouse name: Not on file  ? Number of children: Not on file  ? Years of education: Not on file  ? Highest education level: Not on file  ?Occupational History  ? Not on file  ?Tobacco Use  ? Smoking status: Never  ? Smokeless tobacco: Never  ?Vaping Use  ? Vaping Use: Never used  ?Substance and Sexual Activity  ? Alcohol use: No  ? Drug use: No  ? Sexual activity: Yes  ?  Birth control/protection: Injection  ?Other Topics Concern  ? Not on file  ?Social History Narrative  ? Not on file  ? ?Social Determinants of Health  ? ?Financial Resource Strain: Not on file  ?Food Insecurity: Not on file  ?Transportation Needs: Not on file  ?Physical Activity: Not on file  ?Stress: Not on file  ?Social Connections: Not on file  ? ? ?Review of Systems: ?Gen: Denies fever, chills, cold or flulike symptoms, presyncope, syncope. ?CV: Denies chest pain, palpitations. ?Resp: Denies dyspnea or cough. ?GI: See HPI ?Heme: See HPI ? ?Physical Exam: ?BP 133/82   Pulse 80   Temp 97.7 ?F (36.5 ?C) (Temporal)   Ht '5\' 7"'$  (1.702 m)   Wt 150 lb 12.8 oz (68.4 kg)   BMI 23.62 kg/m?  ?General:   Alert and oriented. No distress noted. Pleasant and cooperative.  ?Head:  Normocephalic and atraumatic. ?Eyes:  Conjuctiva clear without scleral icterus. ?Heart:  S1, S2 present without murmurs appreciated. ?Lungs:  Clear to auscultation bilaterally. No wheezes, rales, or rhonchi. No distress.  ?Abdomen:  +BS, soft, non-tender and non-distended. No rebound or guarding. No HSM or masses noted. ?Msk:  Symmetrical without gross  deformities. Normal posture. ?Extremities:  Without edema. ?Neurologic:  Alert and  oriented x4 ?Psych:  Normal mood and affect. ? ? ? ?Assessment:  ?46 year old female with history of GERD, constipation, hemorrhoids, presenting today with chief complaint of diarrhea. ? ?Diarrhea: ?Up to 12 loose to watery watery BMs daily for the last couple of years.  Symptoms are worsened with meals regardless of what she eats, somewhat improved with imodium.  Associated occasional lower abdominal cramping prior to bowel movements that improves thereafter.  Down 7 pounds over the last 9 months the patient reports her weight fluctuates.  Also with occasional toilet tissue hematochezia, but known hemorrhoids.  Denies nocturnal stools, new medications, NSAIDs, antibiotics  prior to onset, family history of colon cancer or IBD.  Colonoscopy March 2019 for hematochezia revealing internal hemorrhoids recommendations to repeat in 10 years. ? ?At this time, etiology is unclear.  We will check stool studies to evaluate for infectious diarrhea, thyroid function, screen for celiac disease, and check inflammatory markers.  It is possible that she has IBS-D.  Discussed pursuing a colonoscopy if labs were unrevealing.  Patient states she prefers to hold off on this is we can.  ? ?GERD: ?Chronic.  Well-controlled on Dexilant 60 mg daily.  No alarm symptoms. ? ? ?Plan:  ?CBC, BMP, C. difficile toxin A/B with reflex, GI profile panel, IgA, TTG IgA, TSH. ?Imodium 30 mL after first loose stool followed by an additional 15 mL after additional loose stools up to 2 doses. ?Continue Dexilant 60 mg daily. ?Further recommendations to follow labs and stool studies. ? ? ?Aliene Altes, PA-C ?[facility] ?07/01/2021  ?

## 2021-07-01 ENCOUNTER — Ambulatory Visit (INDEPENDENT_AMBULATORY_CARE_PROVIDER_SITE_OTHER): Payer: Medicaid Other | Admitting: Gastroenterology

## 2021-07-01 ENCOUNTER — Encounter: Payer: Self-pay | Admitting: Gastroenterology

## 2021-07-01 VITALS — BP 133/82 | HR 80 | Temp 97.7°F | Ht 67.0 in | Wt 150.8 lb

## 2021-07-01 DIAGNOSIS — K625 Hemorrhage of anus and rectum: Secondary | ICD-10-CM | POA: Diagnosis not present

## 2021-07-01 DIAGNOSIS — R197 Diarrhea, unspecified: Secondary | ICD-10-CM | POA: Diagnosis not present

## 2021-07-01 DIAGNOSIS — K219 Gastro-esophageal reflux disease without esophagitis: Secondary | ICD-10-CM | POA: Diagnosis not present

## 2021-07-01 NOTE — Patient Instructions (Addendum)
Please have blood work and stool studies completed at [facility]. ?[facility] address: [place], [place], [place] ? ?Continue to use imodium as needed.  ?Take 30 mL of Imodium after your first loose stool, followed by an additional 15 mL of Imodium after additional loose stools up to 2 doses.  ? ?We will call you with your lab results and stool study results.  Further recommendations to follow this. ? ?It was good to meet you today! Have a Happy Easter!  ? ?Aliene Altes, PA-C ?[facility] ? ?

## 2021-07-06 ENCOUNTER — Ambulatory Visit (HOSPITAL_COMMUNITY)
Admission: RE | Admit: 2021-07-06 | Discharge: 2021-07-06 | Disposition: A | Discharge status: Home / Self Care | Payer: Medicaid Other | Source: Ambulatory Visit | Attending: Family Medicine | Admitting: Family Medicine

## 2021-07-06 DIAGNOSIS — Z1231 Encounter for screening mammogram for malignant neoplasm of breast: Secondary | ICD-10-CM | POA: Diagnosis present

## 2021-07-08 ENCOUNTER — Other Ambulatory Visit: Payer: Self-pay | Admitting: Gastroenterology

## 2021-07-08 DIAGNOSIS — A498 Other bacterial infections of unspecified site: Secondary | ICD-10-CM

## 2021-07-08 LAB — CBC WITH DIFFERENTIAL/PLATELET
Basophils Absolute: 0.1 10*3/uL (ref 0.0–0.2)
Basos: 1 %
EOS (ABSOLUTE): 0 10*3/uL (ref 0.0–0.4)
Eos: 1 %
Hematocrit: 40.7 % (ref 34.0–46.6)
Hemoglobin: 13.2 g/dL (ref 11.1–15.9)
Immature Grans (Abs): 0 10*3/uL (ref 0.0–0.1)
Immature Granulocytes: 1 %
Lymphocytes Absolute: 1.8 10*3/uL (ref 0.7–3.1)
Lymphs: 41 %
MCH: 26.7 pg (ref 26.6–33.0)
MCHC: 32.4 g/dL (ref 31.5–35.7)
MCV: 82 fL (ref 79–97)
Monocytes Absolute: 0.4 10*3/uL (ref 0.1–0.9)
Monocytes: 9 %
Neutrophils Absolute: 2.1 10*3/uL (ref 1.4–7.0)
Neutrophils: 47 %
Platelets: 204 10*3/uL (ref 150–450)
RBC: 4.95 x10E6/uL (ref 3.77–5.28)
RDW: 11.9 % (ref 11.7–15.4)
WBC: 4.3 10*3/uL (ref 3.4–10.8)

## 2021-07-08 LAB — GI PROFILE, STOOL, PCR

## 2021-07-08 LAB — BASIC METABOLIC PANEL
BUN/Creatinine Ratio: 9 (ref 9–23)
BUN: 8 mg/dL (ref 6–24)
CO2: 25 mmol/L (ref 20–29)
Calcium: 10.1 mg/dL (ref 8.7–10.2)
Chloride: 101 mmol/L (ref 96–106)
Creatinine, Ser: 0.9 mg/dL (ref 0.57–1.00)
Glucose: 92 mg/dL (ref 70–99)
Potassium: 3.7 mmol/L (ref 3.5–5.2)
Sodium: 141 mmol/L (ref 134–144)
eGFR: 81 mL/min/{1.73_m2} (ref >59)

## 2021-07-08 LAB — TISSUE TRANSGLUTAMINASE, IGA: Transglutaminase IgA: <2 U/mL (ref 0–3)

## 2021-07-08 LAB — C-REACTIVE PROTEIN: CRP: 2 mg/L (ref 0–10)

## 2021-07-08 LAB — IGA: IgA/Immunoglobulin A, Serum: 337 mg/dL (ref 87–352)

## 2021-07-08 LAB — SEDIMENTATION RATE: Sed Rate: 38 mm/hr — ABNORMAL HIGH (ref 0–32)

## 2021-07-08 LAB — TSH: TSH: 1.75 u[IU]/mL (ref 0.450–4.500)

## 2021-07-08 MED ORDER — DIFICID 200 MG PO TABS: 200.0000 mg | ORAL_TABLET | Freq: Two times a day (BID) | ORAL | 0 refills | Status: DC

## 2021-07-09 ENCOUNTER — Telehealth: Payer: Self-pay | Admitting: Internal Medicine

## 2021-07-09 NOTE — Telephone Encounter (Signed)
PATIENT CALLED ASKING IF HER MEDICATION FOR C-DIFF HAD BEEN AUTHORIZED.   ?

## 2021-07-09 NOTE — Telephone Encounter (Signed)
See other note

## 2021-07-09 NOTE — Telephone Encounter (Signed)
Pt was asking if we received a PA on her medication, She uses [facility]. [phone] ?

## 2021-07-09 NOTE — Telephone Encounter (Signed)
Spoke to pt, informed her that we were waiting on approval and someone would give her a call when we know.  ?

## 2021-07-09 NOTE — Progress Notes (Signed)
Cyril Mourning, ?I phoned to [facility] and they didn't have a PA on file. So I spoke with Ebony Hail over the phone for the Dificid 200 mg tab to start the PA process. She did advised that there is a 24 hr turn around time for all PA"s and that I can check the portal later today or tomorrow to see if it was approved. Reference # [id number] and the PA # [id number]. ?

## 2021-07-10 ENCOUNTER — Other Ambulatory Visit: Payer: Self-pay | Admitting: Gastroenterology

## 2021-07-10 DIAGNOSIS — A498 Other bacterial infections of unspecified site: Secondary | ICD-10-CM

## 2021-07-10 LAB — C DIFFICILE, CYTOTOXIN B

## 2021-07-10 LAB — C DIFFICILE TOXINS A+B W/RFLX: C difficile Toxins A+B, EIA: NEGATIVE

## 2021-07-10 MED ORDER — VANCOMYCIN HCL 125 MG PO CAPS: 125.0000 mg | ORAL_CAPSULE | Freq: Four times a day (QID) | ORAL | 0 refills | Status: AC

## 2021-07-10 NOTE — Telephone Encounter (Signed)
noted 

## 2021-07-13 NOTE — Telephone Encounter (Signed)
Noted. Glad patient picked up vancomycin. We will see how this does for her.  ?

## 2021-07-13 NOTE — Telephone Encounter (Signed)
FYI: ? ?Vickie Epley, ?I looked on [place] [employer] and there wasn't any authorization for the pt yet. I phoned to [facility] and the pt did pick up her Vancomycin on Friday @ 11:40 am according to the pharmacist Mitzi Hansen. ?I did call [place] [employer] anyway to speak to someone and after being transferred to the PA dept I was on hold for 12 minutes and the phone hung up. ?

## 2021-07-13 NOTE — Telephone Encounter (Signed)
noted 

## 2021-07-14 NOTE — Telephone Encounter (Signed)
Checked on the website [place] [employer] and a decision has not been posted for the pt's Dificid. ?Will check at the end of the week ?

## 2021-08-11 ENCOUNTER — Telehealth: Payer: Self-pay | Admitting: Internal Medicine

## 2021-08-11 NOTE — Telephone Encounter (Signed)
Spoke to pt, she informed me that Vancomycin did not work for her. She stated she is still having diarrhea.  ?

## 2021-08-11 NOTE — Telephone Encounter (Signed)
Spoke with patient. She reports having some improvement in diarrhea while on vancomycin. Bms decreased from 11-12 per day to 5-6 per day. Continues with 5-6 Bms per day but is now taking 1-2 doses of imodium daily which she wasn't taking previously. No abdominal pain, brbpr, melena, fever. Suspect inadequate response to course of vancomycin. I would like to try her on Dificid. [name] was previously trying to get this approved, but no documentation in the chart regarding final approval or denial.  ? ?Gabrielle Scott- was patient approved for Dificid. She has now completed a course of vancomycin and continues with diarrhea. Would like to get her started on Dificid 200 mg BID x 10 days.  ?

## 2021-08-11 NOTE — Telephone Encounter (Signed)
Pt needs to speak with nurse about her medication. [phone] ?

## 2021-08-12 ENCOUNTER — Other Ambulatory Visit: Payer: Self-pay | Admitting: Gastroenterology

## 2021-08-12 DIAGNOSIS — A0472 Enterocolitis due to Clostridium difficile, not specified as recurrent: Secondary | ICD-10-CM | POA: Insufficient documentation

## 2021-08-12 MED ORDER — DIFICID 200 MG PO TABS: 200.0000 mg | ORAL_TABLET | Freq: Two times a day (BID) | ORAL | 0 refills | Status: DC

## 2021-08-12 NOTE — Telephone Encounter (Signed)
Great. Thank you.

## 2021-08-12 NOTE — Telephone Encounter (Signed)
noted 

## 2021-08-12 NOTE — Telephone Encounter (Signed)
I checked the [place] [employer] website to see what the status on the previous PA for Dificid was and there is no record of one being done. We can try to resubmit the RX to the pharmacy to generate a new PA and go from there.  ?

## 2021-08-12 NOTE — Telephone Encounter (Signed)
Yes, lets do that please.  ?

## 2021-08-12 NOTE — Telephone Encounter (Signed)
Contacted pharmacy and Dificid did not require a PA. Patient will only have to pay $4. Left message with patient's mother to have patient return my call to let her know that it is at the pharmacy for her to pick up.  ?

## 2021-10-13 ENCOUNTER — Encounter: Payer: Self-pay | Admitting: Internal Medicine

## 2021-10-13 ENCOUNTER — Ambulatory Visit: Payer: Medicaid Other | Admitting: Internal Medicine

## 2021-10-13 ENCOUNTER — Other Ambulatory Visit: Payer: Self-pay

## 2021-10-13 VITALS — BP 132/89 | HR 77 | Temp 97.5°F | Ht 66.0 in | Wt 152.2 lb

## 2021-10-13 DIAGNOSIS — K580 Irritable bowel syndrome with diarrhea: Secondary | ICD-10-CM

## 2021-10-13 DIAGNOSIS — A498 Other bacterial infections of unspecified site: Secondary | ICD-10-CM

## 2021-10-13 DIAGNOSIS — R197 Diarrhea, unspecified: Secondary | ICD-10-CM

## 2021-10-13 DIAGNOSIS — K219 Gastro-esophageal reflux disease without esophagitis: Secondary | ICD-10-CM | POA: Diagnosis not present

## 2021-10-13 MED ORDER — DEXLANSOPRAZOLE 60 MG PO CPDR: 60.0000 mg | DELAYED_RELEASE_CAPSULE | Freq: Every day | ORAL | 3 refills | Status: DC

## 2021-10-13 MED ORDER — HYOSCYAMINE SULFATE 0.125 MG SL SUBL: 0.1250 mg | SUBLINGUAL_TABLET | SUBLINGUAL | 3 refills | Status: DC | PRN

## 2021-10-13 NOTE — Patient Instructions (Signed)
It was good to see you again today !  I believe your C. 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  Dispense 90 with 3 refills  Follow-up appointment with Aliene Altes in about 8 weeks.

## 2021-10-13 NOTE — Progress Notes (Signed)
Primary Care Physician:  Inc, [facility] Primary Gastroenterologist:  Dr. Gala Romney  Pre-Procedure History & Physical: HPI:  Gabrielle Scott is a 46 y.o. female here for follow-up of C. difficile associated diarrhea.  Seen earlier for in the year for diarrhea.  Celiac panel negative.  C. difficile toxin positive.  Inadequate response to vancomycin; took a course of Pradaxa Meissen with resolution of incessant diarrhea.  Now has intermittent loose stools punctuated by periods of normal bowel function and in fact, constipation.  Most the time she is in the normal range but occasionally gets postprandial abdominal cramps and diarrhea.  She is not passing any blood per rectum.  GERD well-controlled on Dexilant.  Past Medical History:  Diagnosis Date   Anal fissure    Anxiety    Benign neoplasm of stomach    Constipation    Elevated serum creatinine    Folliculitis    GERD (gastroesophageal reflux disease)    Hemorrhoids    Hypertension    Overweight    Palpitations    Prediabetes    Schizoaffective disorder (HCC)    Tachycardia    White coat syndrome with diagnosis of hypertension     Past Surgical History:  Procedure Laterality Date   BIOPSY  07/23/2019   Procedure: BIOPSY;  Surgeon: Daneil Dolin, MD;  [facility];  Service: Endoscopy;;  gastric polyps   BREAST LUMPECTOMY Left    Cannot remember date; non-cancerous   COLONOSCOPY WITH PROPOFOL N/A 06/09/2017   Surgeon: Daneil Dolin, MD; internal hemorrhoids.  Recommended repeat in 10 years.   ESOPHAGOGASTRODUODENOSCOPY (EGD) WITH PROPOFOL N/A 07/23/2019   Procedure: ESOPHAGOGASTRODUODENOSCOPY (EGD) WITH PROPOFOL;  Surgeon: Daneil Dolin, MD;  [facility];  Service: Endoscopy;  Laterality: N/A;  2:30pm   gastric polypectomy  2013   [facility]   WISDOM TOOTH EXTRACTION      Prior to Admission medications   Medication Sig Start Date End Date Taking? Authorizing Provider  CALCIUM 600/VITAMIN  D3 600-800 MG-UNIT TABS Take 1 tablet by mouth in the morning and at bedtime. 05/22/19  Yes [provider]  cetirizine (ZYRTEC) 10 MG tablet Take 10 mg by mouth daily.   Yes [provider]  dexlansoprazole (DEXILANT) 60 MG capsule Take 1 capsule (60 mg total) by mouth daily before breakfast. 10/22/20  Yes Mahala Menghini, PA-C  LORazepam (ATIVAN) 0.5 MG tablet Take 0.5 mg by mouth 2 (two) times daily as needed for anxiety.    Yes [provider]  losartan-hydrochlorothiazide (HYZAAR) 50-12.5 MG tablet Take 1 tablet by mouth daily.   Yes [provider]  MedroxyPROGESTERone Acetate (DEPO-PROVERA IM) Inject into the muscle every 3 (three) months.   Yes [provider]  metoprolol succinate (TOPROL-XL) 100 MG 24 hr tablet Take 100 mg by mouth daily. Take with or immediately following a meal.   Yes [provider]  QUEtiapine (SEROQUEL) 25 MG tablet Take 25 mg by mouth at bedtime.   Yes [provider]  RISPERDAL CONSTA 50 MG injection Inject 50 mg into the muscle every 14 (fourteen) days. 06/25/19  Yes [provider]  fidaxomicin (DIFICID) 200 MG TABS tablet Take 1 tablet (200 mg total) by mouth 2 (two) times daily. Patient not taking: Reported on 10/13/2021 08/12/21   Erenest Rasher, PA-C  hydrocortisone (ANUSOL-HC) 2.5 % rectal cream Place 1 application rectally as needed for hemorrhoids. Patient not taking: Reported on 10/13/2021 05/24/19   [provider]  Allergies as of 10/13/2021   (No Known Allergies)    Family History  Problem Relation Age of Onset   Colon cancer Neg Hx    Gastric cancer Neg Hx    Esophageal cancer Neg Hx    Inflammatory bowel disease Neg Hx     Social History   Socioeconomic History   Marital status: Single    Spouse name: Not on file   Number of children: Not on file   Years of education: Not on file   Highest education level: Not on file  Occupational History   Not on file   Tobacco Use   Smoking status: Never   Smokeless tobacco: Never  Vaping Use   Vaping Use: Never used  Substance and Sexual Activity   Alcohol use: No   Drug use: No   Sexual activity: Yes    Birth control/protection: Injection  Other Topics Concern   Not on file  Social History Narrative   Not on file   Social Determinants of Health   Financial Resource Strain: Not on file  Food Insecurity: Not on file  Transportation Needs: Not on file  Physical Activity: Not on file  Stress: Not on file  Social Connections: Not on file  Intimate Partner Violence: Not on file    Review of Systems: See HPI, otherwise negative ROS  Physical Exam: BP 132/89   Pulse 77   Temp (!) 97.5 F (36.4 C)   Ht '5\' 6"'$  (1.676 m)   Wt 152 lb 3.2 oz (69 kg)   BMI 24.57 kg/m  General:   Alert,  Well-developed, well-nourished, pleasant and cooperative in NAD Neck:  Supple; no masses or thyromegaly. No significant cervical adenopathy. Lungs:  Clear throughout to auscultation.   No wheezes, crackles, or rhonchi. No acute distress. Heart:  Regular rate and rhythm; no murmurs, clicks, rubs,  or gallops. Abdomen: Non-distended, normal bowel sounds.  Soft and nontender without appreciable mass or hepatosplenomegaly.  Pulses:  Normal pulses noted. Extremities:  Without clubbing or edema.  Impression/Plan:    Pleasant 46 year old lady with recent C. difficile associated diarrhea.  She was treated with Pradaxa mycin.  She had a nice response.  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  GERD continues to be well controlled on Dexilant without any alarm features.  Recommendations:  You should begin a probiotic once a day to help restore the balance in your intestines  Examples would include align, digestive advantage and Hardin Negus' colon healt  New prescription for Levsin 0.125 mg tablets sublingual dispense 40 with 3  refills  Take 1 under the tongue 20 minutes before meals and at bedtime as needed for bouts of diarrhea  Continue Dexilant 60 mg daily for GERD.  Dispense 90 with 3 refills  Follow-up appointment with Aliene Altes in about 8 weeks.       Notice: This dictation was prepared with Dragon dictation along with smaller phrase technology. Any transcriptional errors that result from this process are unintentional and may not be corrected upon review.

## 2021-12-06 NOTE — Progress Notes (Unsigned)
Referring Provider: Inc, [place] Physician:  Inc, [place] Primary GI Physician: Dr. Gala Romney  No chief complaint on file.   HPI:   Gabrielle Scott is a 46 y.o. female presenting today for follow-up.  She has history of GERD, C. difficile associated diarrhea diagnosed in April 2023 with inadequate response to vancomycin and treated with Dificid in May.  She was last seen in our office 10/13/2021.  She reported resolution of incessant diarrhea.  Now with intermittent loose stools with periods of normal bowel movements and some constipation at times, but most of the time in the normal range.  Occasional postprandial abdominal cramping and diarrhea.  No bright red blood per rectum.  GERD well controlled on Dexilant.  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  Recommended starting probiotics once daily, Levsin as needed, continue Dexilant, follow-up in 8 weeks.  Today:   Past Medical History:  Diagnosis Date   Anal fissure    Anxiety    Benign neoplasm of stomach    Constipation    Elevated serum creatinine    Folliculitis    GERD (gastroesophageal reflux disease)    Hemorrhoids    Hypertension    Overweight    Palpitations    Prediabetes    Schizoaffective disorder (HCC)    Tachycardia    White coat syndrome with diagnosis of hypertension     Past Surgical History:  Procedure Laterality Date   BIOPSY  07/23/2019   Procedure: BIOPSY;  Surgeon: Daneil Dolin, MD;  [facility];  Service: Endoscopy;;  gastric polyps   BREAST LUMPECTOMY Left    Cannot remember date; non-cancerous   COLONOSCOPY WITH PROPOFOL N/A 06/09/2017   Surgeon: Daneil Dolin, MD; internal hemorrhoids.  Recommended repeat in 10 years.   ESOPHAGOGASTRODUODENOSCOPY (EGD) WITH PROPOFOL N/A 07/23/2019   Procedure: ESOPHAGOGASTRODUODENOSCOPY (EGD) WITH PROPOFOL;  Surgeon: Daneil Dolin, MD;  [facility];  Service: Endoscopy;   Laterality: N/A;  2:30pm   gastric polypectomy  2013   [facility]   WISDOM TOOTH EXTRACTION      Current Outpatient Medications  Medication Sig Dispense Refill   CALCIUM 600/VITAMIN D3 600-800 MG-UNIT TABS Take 1 tablet by mouth in the morning and at bedtime.     cetirizine (ZYRTEC) 10 MG tablet Take 10 mg by mouth daily.     dexlansoprazole (DEXILANT) 60 MG capsule Take 1 capsule (60 mg total) by mouth daily before breakfast. 90 capsule 3   fidaxomicin (DIFICID) 200 MG TABS tablet Take 1 tablet (200 mg total) by mouth 2 (two) times daily. (Patient not taking: Reported on 10/13/2021) 20 tablet 0   hydrocortisone (ANUSOL-HC) 2.5 % rectal cream Place 1 application rectally as needed for hemorrhoids. (Patient not taking: Reported on 10/13/2021)     hyoscyamine (LEVSIN SL) 0.125 MG SL tablet Place 1 tablet (0.125 mg total) under the tongue every 4 (four) hours as needed. 40 tablet 3   LORazepam (ATIVAN) 0.5 MG tablet Take 0.5 mg by mouth 2 (two) times daily as needed for anxiety.      losartan-hydrochlorothiazide (HYZAAR) 50-12.5 MG tablet Take 1 tablet by mouth daily.     MedroxyPROGESTERone Acetate (DEPO-PROVERA IM) Inject into the muscle every 3 (three) months.     metoprolol succinate (TOPROL-XL) 100 MG 24 hr tablet Take 100 mg by mouth daily. Take with or immediately following a meal.     QUEtiapine (SEROQUEL) 25 MG tablet Take 25 mg by mouth  at bedtime.     RISPERDAL CONSTA 50 MG injection Inject 50 mg into the muscle every 14 (fourteen) days.     No current facility-administered medications for this visit.    Allergies as of 12/09/2021   (No Known Allergies)    Family History  Problem Relation Age of Onset   Colon cancer Neg Hx    Gastric cancer Neg Hx    Esophageal cancer Neg Hx    Inflammatory bowel disease Neg Hx     Social History   Socioeconomic History   Marital status: Single    Spouse name: Not on file   Number of children: Not on file   Years of education: Not on file    Highest education level: Not on file  Occupational History   Not on file  Tobacco Use   Smoking status: Never   Smokeless tobacco: Never  Vaping Use   Vaping Use: Never used  Substance and Sexual Activity   Alcohol use: No   Drug use: No   Sexual activity: Yes    Birth control/protection: Injection  Other Topics Concern   Not on file  Social History Narrative   Not on file   Social Determinants of Health   Financial Resource Strain: Not on file  Food Insecurity: Not on file  Transportation Needs: Not on file  Physical Activity: Not on file  Stress: Not on file  Social Connections: Not on file    Review of Systems: Gen: Denies fever, chills, cold or flulike symptoms, presyncope, syncope. CV: Denies chest pain, palpitations. Resp: Denies dyspnea, cough.  GI: See HPI Heme:  See HPI  Physical Exam: There were no vitals taken for this visit. General:   Alert and oriented. No distress noted. Pleasant and cooperative.  Head:  Normocephalic and atraumatic. Eyes:  Conjuctiva clear without scleral icterus. Heart:  S1, S2 present without murmurs appreciated. Lungs:  Clear to auscultation bilaterally. No wheezes, rales, or rhonchi. No distress.  Abdomen:  +BS, soft, non-tender and non-distended. No rebound or guarding. No HSM or masses noted. Msk:  Symmetrical without gross deformities. Normal posture. Extremities:  Without edema. Neurologic:  Alert and  oriented x4 Psych:  Normal mood and affect.    Assessment:     Plan:  ***   Aliene Altes, PA-C [facility] 12/09/2021

## 2021-12-09 ENCOUNTER — Encounter: Payer: Self-pay | Admitting: Gastroenterology

## 2021-12-09 ENCOUNTER — Ambulatory Visit (INDEPENDENT_AMBULATORY_CARE_PROVIDER_SITE_OTHER): Payer: Medicaid Other | Admitting: Gastroenterology

## 2021-12-09 VITALS — BP 135/89 | HR 89 | Temp 98.2°F | Ht 66.0 in | Wt 153.2 lb

## 2021-12-09 DIAGNOSIS — K219 Gastro-esophageal reflux disease without esophagitis: Secondary | ICD-10-CM | POA: Diagnosis not present

## 2021-12-09 DIAGNOSIS — R195 Other fecal abnormalities: Secondary | ICD-10-CM | POA: Diagnosis not present

## 2021-12-09 DIAGNOSIS — Z8619 Personal history of other infectious and parasitic diseases: Secondary | ICD-10-CM

## 2021-12-09 NOTE — Patient Instructions (Addendum)
Resume taking Dexilant 60 mg daily.  You may continue to use Levsin as needed for loose stools.   Let us know if you have recurrent diarrhea.   We will plan to see you back in 6 months or sooner if needed.   It was good to see you today! I am glad you are doing well!  Aliene Altes, PA-C [facility]

## 2022-04-01 ENCOUNTER — Other Ambulatory Visit: Payer: Self-pay | Admitting: Internal Medicine

## 2022-04-01 DIAGNOSIS — K219 Gastro-esophageal reflux disease without esophagitis: Secondary | ICD-10-CM

## 2022-04-01 DIAGNOSIS — R197 Diarrhea, unspecified: Secondary | ICD-10-CM

## 2022-04-01 DIAGNOSIS — K580 Irritable bowel syndrome with diarrhea: Secondary | ICD-10-CM

## 2022-06-02 ENCOUNTER — Other Ambulatory Visit (HOSPITAL_COMMUNITY): Payer: Self-pay | Admitting: Family Medicine

## 2022-06-02 DIAGNOSIS — Z1231 Encounter for screening mammogram for malignant neoplasm of breast: Secondary | ICD-10-CM

## 2022-06-07 ENCOUNTER — Encounter: Payer: Self-pay | Admitting: Gastroenterology

## 2022-06-07 ENCOUNTER — Ambulatory Visit (INDEPENDENT_AMBULATORY_CARE_PROVIDER_SITE_OTHER): Payer: Medicaid Other | Admitting: Gastroenterology

## 2022-06-07 DIAGNOSIS — K219 Gastro-esophageal reflux disease without esophagitis: Secondary | ICD-10-CM

## 2022-06-07 DIAGNOSIS — K580 Irritable bowel syndrome with diarrhea: Secondary | ICD-10-CM | POA: Diagnosis not present

## 2022-06-07 MED ORDER — HYOSCYAMINE SULFATE 0.125 MG SL SUBL: SUBLINGUAL_TABLET | SUBLINGUAL | 3 refills | Status: DC

## 2022-06-07 NOTE — Patient Instructions (Signed)
Continue taking Dexilant 60 mg every other day.  If you start having more frequent reflux symptoms you may increase to every day.  Continue taking hyoscyamine (Levsin) up to every 4 hours as needed for loose stools.  Hold if you have not had a bowel movement in 1-2 days.  If your diarrhea begins to worsen and is not controlled with Levsin please contact the office so we can repeat stool testing.  We will plan to follow-up in 6 months, sooner if needed.  Please not hesitate to call the office you have any questions or concerns.  It was a pleasure to see you today. I want to create trusting relationships with patients. If you receive a survey regarding your visit,  I greatly appreciate you taking time to fill this out on paper or through your MyChart. I value your feedback.  Venetia Night, MSN, FNP-BC, AGACNP-BC [facility]

## 2022-06-07 NOTE — Progress Notes (Signed)
GI Office Note    Referring Provider: [facility], [place] Physician:  [facility], [place] Primary Gastroenterologist: Cristopher [email or web address].[email or web address], MD   Date:  06/07/2022  ID:  Shauna Hugh, DOB 1975-09-20, MRN [id number]   Chief Complaint   Chief Complaint  Patient presents with   Follow-up    Still having issues with going to the bathroom a lot    History of Present Illness  Laelynn Deschaine is a 47 y.o. female with a history of GERD, C. difficile diagnosed April 2023 with an inadequate response to vancomycin and was treated with Dificid in May 2023 presenting today for follow-up.  In July 2023 she has symptoms suspicious for postinfectious IBS, started on probiotics once daily and Levsin as needed.  Colonoscopy March 2019: -Entire colon normal -Internal hemorrhoids -Trial course of [place] suppositories -Recommended repeat in 10 years  EGD April 2021: -Normal esophagus -Multiple gastric polyps (2 largest removed) -fundic gland polyp -Normal duodenum -Events continue Dexilant daily for reflux.  Last office visit 12/09/2021. Taking Nexium once daily but has not needed it in quite some time.  Bowels moving daily, occasionally twice daily.  Loose/mushy but not watery.  Taking probiotic.  Denies any melena or BRBPR.  No abdominal pain.  Reflux controlled with Dexilant every other day.  Proceed with breakthrough every 2-3 days will use Alka-Seltzer.  Mild nausea but no vomiting or dysphagia.  Advised to resume Dexilant 60 mg daily.  Use Levsin as needed.  Monitor for recurrent diarrhea.  Follow-up in 6 months, sooner if needed  Today: Loose stools- About 2-3 days out of the week she has looser stools. Sometimes she has hard stools and some days she may not have a BM at all. Does not have abdominal pain when she has diarrhea. At times she has to strain a little. No melena or brbpr. How often she uses levsin depends on the week. Will have 2-3 episodes of loose stool before  she will take levsin. Typically only needs one dose for relief. Sometimes does not stop it completely.   GERD - No N/V, dysphagia. Reflux is pretty well controlled. Reports when she takes her acid reflux medication she will usually have a BM. Takes it every other day. Does not really have breakthrough symptoms at all. Does not have a god appetite in the mornings but by lunch times she is able to eat okay. Weight is stable.    Current Outpatient Medications  Medication Sig Dispense Refill   CALCIUM 600/VITAMIN D3 600-800 MG-UNIT TABS Take 1 tablet by mouth in the morning and at bedtime.     cetirizine (ZYRTEC) 10 MG tablet Take 10 mg by mouth daily.     cyclobenzaprine (FLEXERIL) 5 MG tablet 1 tablet Orally every 8 hours for 30 days     dexlansoprazole (DEXILANT) 60 MG capsule Take 1 capsule (60 mg total) by mouth daily before breakfast. 90 capsule 3   LORazepam (ATIVAN) 0.5 MG tablet Take 0.5 mg by mouth 2 (two) times daily as needed for anxiety.      losartan-hydrochlorothiazide (HYZAAR) 50-12.5 MG tablet Take 1 tablet by mouth daily.     MedroxyPROGESTERone Acetate (DEPO-PROVERA IM) Inject into the muscle every 3 (three) months.     metoprolol succinate (TOPROL-XL) 100 MG 24 hr tablet Take 100 mg by mouth daily. Take with or immediately following a meal.     QUEtiapine (SEROQUEL) 25 MG tablet Take 100 mg by mouth at bedtime.     RISPERDAL  CONSTA 50 MG injection Inject 50 mg into the muscle every 14 (fourteen) days.     hyoscyamine (LEVSIN SL) 0.125 MG SL tablet PLACE 1 TABLET UNDER THE TONGUE EVERY 4 HOURS AS NEEDED. 40 tablet 3   No current facility-administered medications for this visit.    Past Medical History:  Diagnosis Date   Anal fissure    Anxiety    Benign neoplasm of stomach    Constipation    Elevated serum creatinine    Folliculitis    GERD (gastroesophageal reflux disease)    Hemorrhoids    Hypertension    Overweight    Palpitations    Prediabetes     Schizoaffective disorder (HCC)    Tachycardia    White coat syndrome with diagnosis of hypertension     Past Surgical History:  Procedure Laterality Date   BIOPSY  07/23/2019   Procedure: BIOPSY;  Surgeon: Daneil Dolin, MD;  [facility];  Service: Endoscopy;;  gastric polyps   BREAST LUMPECTOMY Left    Cannot remember date; non-cancerous   COLONOSCOPY WITH PROPOFOL N/A 06/09/2017   Surgeon: Daneil Dolin, MD; internal hemorrhoids.  Recommended repeat in 10 years.   ESOPHAGOGASTRODUODENOSCOPY (EGD) WITH PROPOFOL N/A 07/23/2019   Procedure: ESOPHAGOGASTRODUODENOSCOPY (EGD) WITH PROPOFOL;  Surgeon: Daneil Dolin, MD;  [facility];  Service: Endoscopy;  Laterality: N/A;  2:30pm   gastric polypectomy  2013   [facility]   WISDOM TOOTH EXTRACTION      Family History  Problem Relation Age of Onset   Colon cancer Neg Hx    Gastric cancer Neg Hx    Esophageal cancer Neg Hx    Inflammatory bowel disease Neg Hx     Allergies as of 06/07/2022   (No Known Allergies)    Social History   Socioeconomic History   Marital status: Single    Spouse name: Not on file   Number of children: Not on file   Years of education: Not on file   Highest education level: Not on file  Occupational History   Not on file  Tobacco Use   Smoking status: Never   Smokeless tobacco: Never  Vaping Use   Vaping Use: Never used  Substance and Sexual Activity   Alcohol use: No   Drug use: No   Sexual activity: Yes    Birth control/protection: Injection  Other Topics Concern   Not on file  Social History Narrative   Not on file   Social Determinants of Health   Financial Resource Strain: Not on file  Food Insecurity: Not on file  Transportation Needs: Not on file  Physical Activity: Not on file  Stress: Not on file  Social Connections: Not on file     Review of Systems   Gen: Denies fever, chills, anorexia. Denies fatigue, weakness, weight loss.  CV: Denies chest  pain, palpitations, syncope, peripheral edema, and claudication. Resp: Denies dyspnea at rest, cough, wheezing, coughing up blood, and pleurisy. GI: See HPI Derm: Denies rash, itching, dry skin Psych: Denies depression, anxiety, memory loss, confusion. No homicidal or suicidal ideation.  Heme: Denies bruising, bleeding, and enlarged lymph nodes.   Physical Exam   BP 120/84 (BP Location: Right Arm, Patient Position: Sitting, Cuff Size: Normal)   Pulse 96   Temp 98.5 F (36.9 C) (Oral)   Ht 5' 5.5" (1.664 m)   Wt 158 lb 9.6 oz (71.9 kg)   LMP  (LMP Unknown)   SpO2 98%  BMI 25.99 kg/m   General:   Alert and oriented. No distress noted. Pleasant and cooperative.  Head:  Normocephalic and atraumatic. Eyes:  Conjuctiva clear without scleral icterus. Mouth:  Oral mucosa pink and moist. Good dentition. No lesions. Lungs:  Clear to auscultation bilaterally. No wheezes, rales, or rhonchi. No distress.  Heart:  S1, S2 present without murmurs appreciated.  Abdomen:  +BS, soft, non-tender and non-distended. No rebound or guarding. No HSM or masses noted. Rectal: Deferred Msk:  Symmetrical without gross deformities. Normal posture. Extremities:  Without edema. Neurologic:  Alert and  oriented x4 Psych:  Alert and cooperative. Normal mood and affect.   Assessment  Jalexy Guck is a 47 y.o. female with a history of GERD, C. 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  History of C. difficile infection and postinfectious IBS: Diagnosed with C. difficile in April 2023.  Failed initial treatment with vancomycin and then retreated with Dificid.  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  Had good initial improvement with Levsin.  Currently having looser stools about 2-3 times per week and having fair results with Levsin.  Some days she has hard stools and some days she may skip a day without a bowel movement.  Some of this could be  related to Levsin use and advised patient that this could be the case.  Advised patient to follow-up in the clinic if she has recurrent diarrhea uncontrolled with Levsin or she develops any fevers or chills.  Levsin refilled today per her request.  GERD: Well-controlled on Dexilant 60 mg every other day.  Denies any breakthrough symptoms.  No alarm symptoms present.  Will refill as needed.  PLAN   Continue Levsin as needed, refilled today.  Hold in the setting of constipation Continue Dexilant 60 mg every other day.  Follow up if worsening diarrhea uncontrolled with Levsin.  Follow up in 6 months, sooner if needed.      Venetia Night, MSN, FNP-BC, AGACNP-BC [facility]

## 2022-07-14 ENCOUNTER — Encounter (HOSPITAL_COMMUNITY): Payer: Self-pay

## 2022-07-14 ENCOUNTER — Ambulatory Visit (HOSPITAL_COMMUNITY)
Admission: RE | Admit: 2022-07-14 | Discharge: 2022-07-14 | Disposition: A | Discharge status: Home / Self Care | Payer: Medicaid Other | Source: Ambulatory Visit | Attending: Family Medicine | Admitting: Family Medicine

## 2022-07-14 DIAGNOSIS — Z1231 Encounter for screening mammogram for malignant neoplasm of breast: Secondary | ICD-10-CM | POA: Diagnosis present

## 2022-07-16 ENCOUNTER — Encounter (HOSPITAL_COMMUNITY): Payer: Self-pay | Admitting: Family Medicine

## 2022-07-27 ENCOUNTER — Encounter (HOSPITAL_COMMUNITY): Payer: Self-pay | Admitting: Family Medicine

## 2022-07-28 ENCOUNTER — Encounter (HOSPITAL_COMMUNITY): Payer: Self-pay | Admitting: Family Medicine

## 2022-08-09 ENCOUNTER — Encounter: Payer: Self-pay | Admitting: Family Medicine

## 2022-08-16 ENCOUNTER — Encounter (HOSPITAL_COMMUNITY): Payer: Self-pay | Admitting: Family Medicine

## 2022-08-17 ENCOUNTER — Other Ambulatory Visit (HOSPITAL_COMMUNITY): Payer: Self-pay | Admitting: Family Medicine

## 2022-08-17 DIAGNOSIS — R928 Other abnormal and inconclusive findings on diagnostic imaging of breast: Secondary | ICD-10-CM

## 2022-08-24 ENCOUNTER — Ambulatory Visit (HOSPITAL_COMMUNITY)
Admission: RE | Admit: 2022-08-24 | Discharge: 2022-08-24 | Disposition: A | Discharge status: Home / Self Care | Payer: Medicaid Other | Source: Ambulatory Visit | Attending: Family Medicine | Admitting: Family Medicine

## 2022-08-24 ENCOUNTER — Encounter (HOSPITAL_COMMUNITY): Payer: Self-pay

## 2022-08-24 DIAGNOSIS — R928 Other abnormal and inconclusive findings on diagnostic imaging of breast: Secondary | ICD-10-CM | POA: Insufficient documentation

## 2022-09-09 ENCOUNTER — Telehealth: Payer: Self-pay | Admitting: *Deleted

## 2022-09-09 ENCOUNTER — Other Ambulatory Visit: Payer: Self-pay | Admitting: Gastroenterology

## 2022-09-09 DIAGNOSIS — K219 Gastro-esophageal reflux disease without esophagitis: Secondary | ICD-10-CM

## 2022-09-09 DIAGNOSIS — K580 Irritable bowel syndrome with diarrhea: Secondary | ICD-10-CM

## 2022-09-09 MED ORDER — HYOSCYAMINE SULFATE 0.125 MG SL SUBL: SUBLINGUAL_TABLET | SUBLINGUAL | 3 refills | Status: DC

## 2022-09-09 NOTE — Telephone Encounter (Signed)
Noted  

## 2022-09-09 NOTE — Telephone Encounter (Signed)
Pt called and states she needs a refill on Levsin sent to [facility]. Pt last OV 06/07/22.

## 2022-09-23 IMAGING — MG MM DIGITAL SCREENING BILAT W/ TOMO AND CAD
8 series · 9 of 24 positions shown · non-contrast
Comparison: Previous exam(s).

CLINICAL DATA: Screening.

EXAM:
DIGITAL SCREENING BILATERAL MAMMOGRAM WITH TOMOSYNTHESIS AND CAD
TECHNIQUE: Bilateral screening digital craniocaudal and mediolateral oblique
mammograms were obtained. Bilateral screening digital breast
tomosynthesis was performed. The images were evaluated with
computer-aided detection.

[L MLO synth-2D]
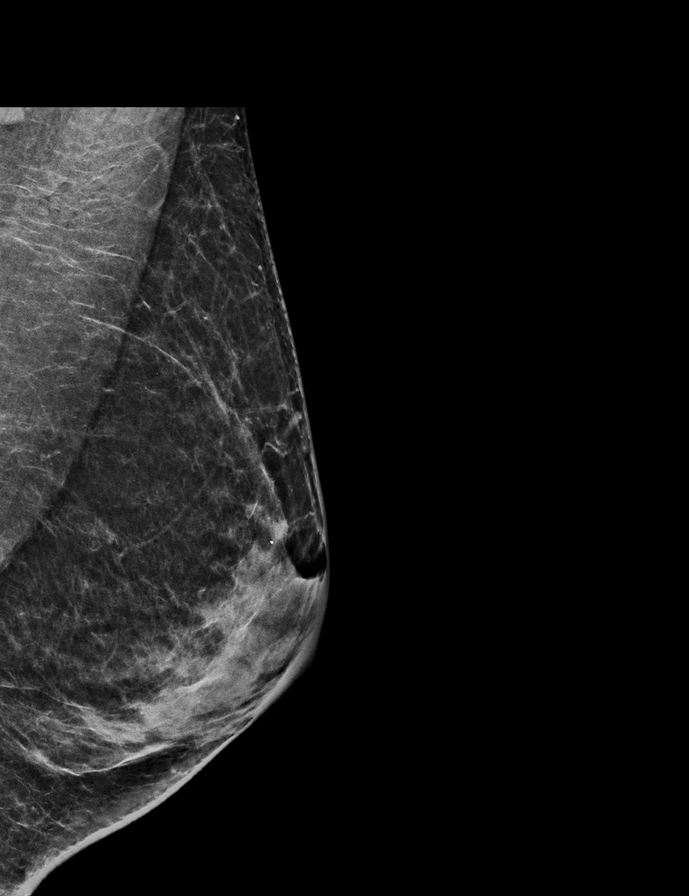

[R MLO synth-2D]
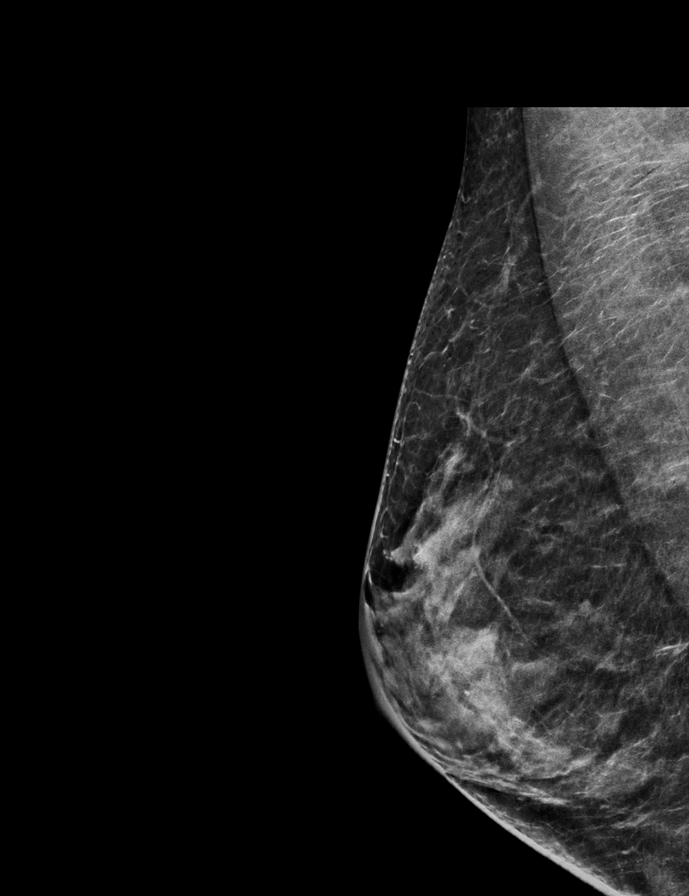

[L CC synth-2D]
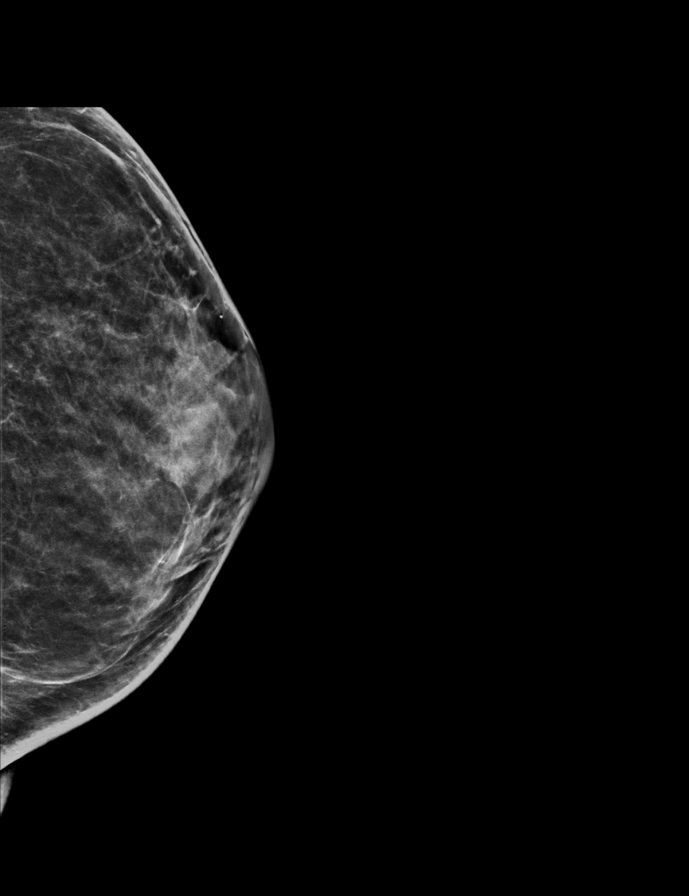

[R CC synth-2D]
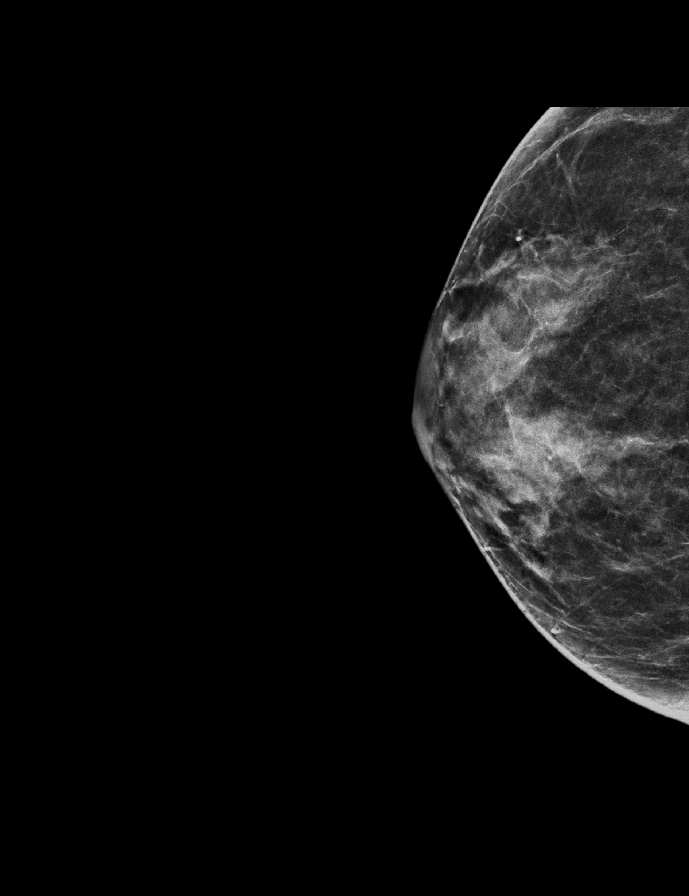

[L CC tomo · 2 of 58 frames shown]
[frame 19/58]
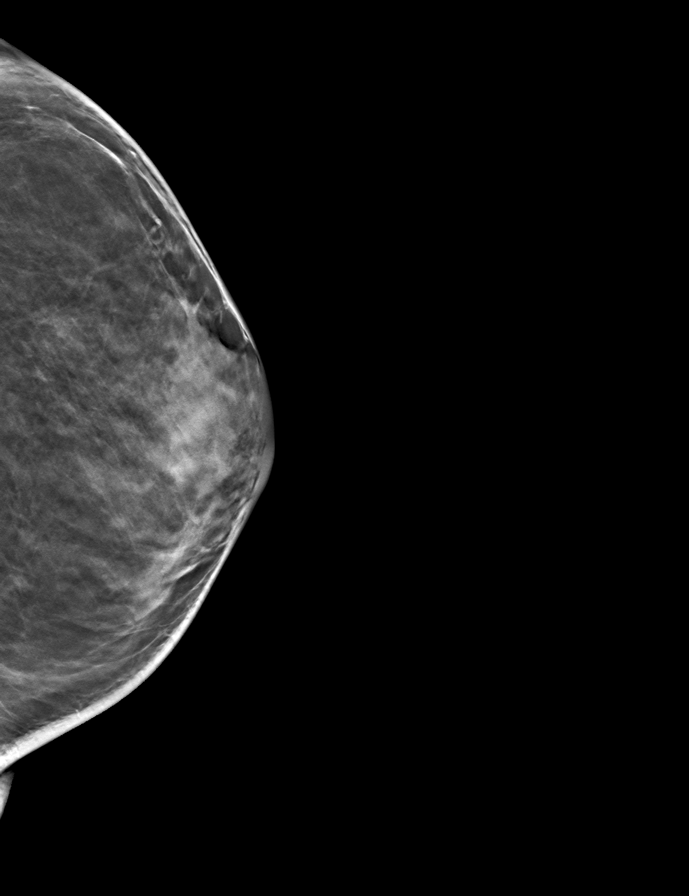
[frame 29/58]
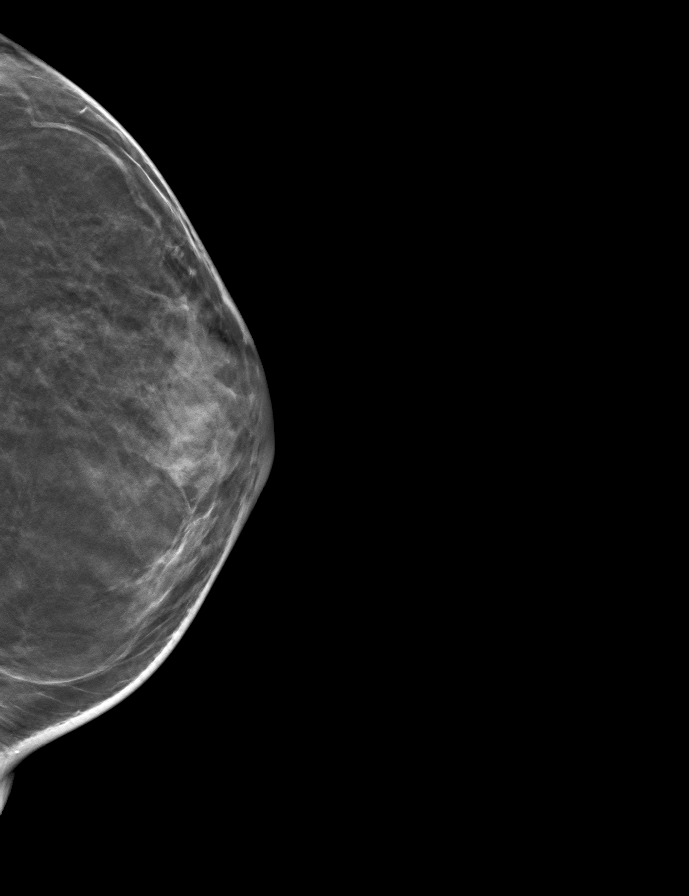

[R MLO tomo · tomo slice 31/62.0]
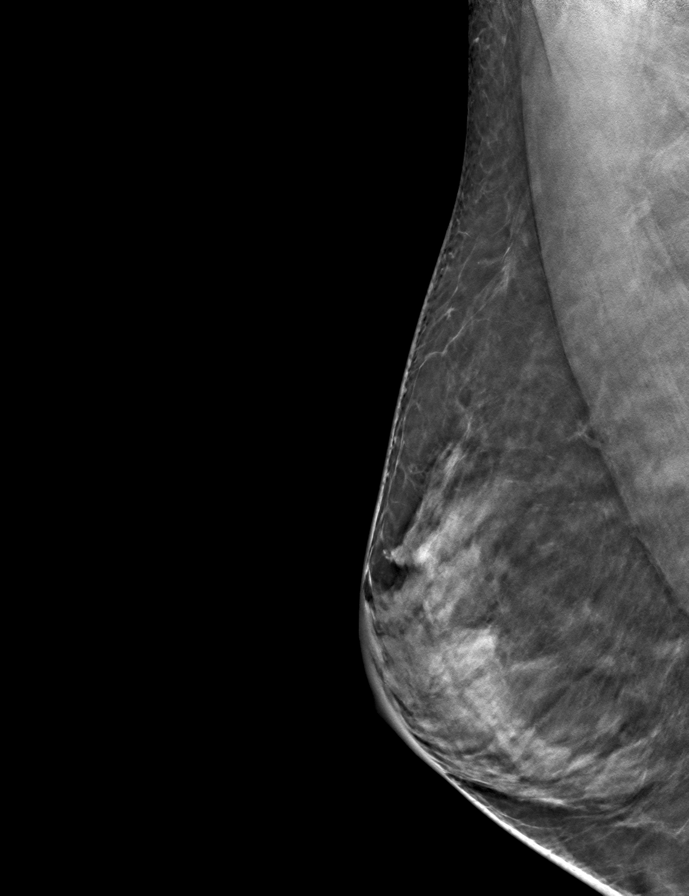

[R CC tomo · tomo slice 29/57.0]
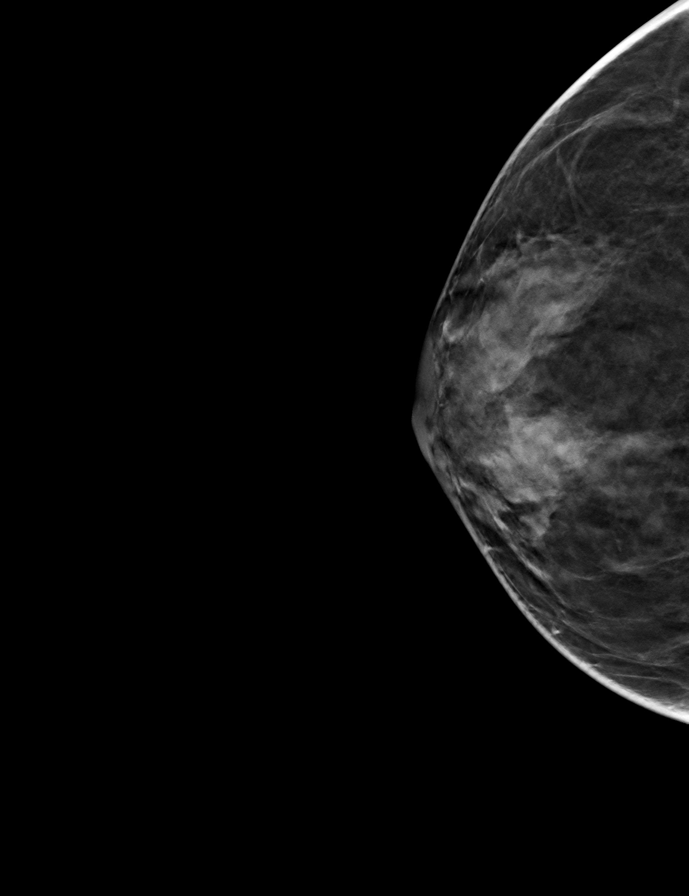

[L MLO tomo · tomo slice 29/57.0]
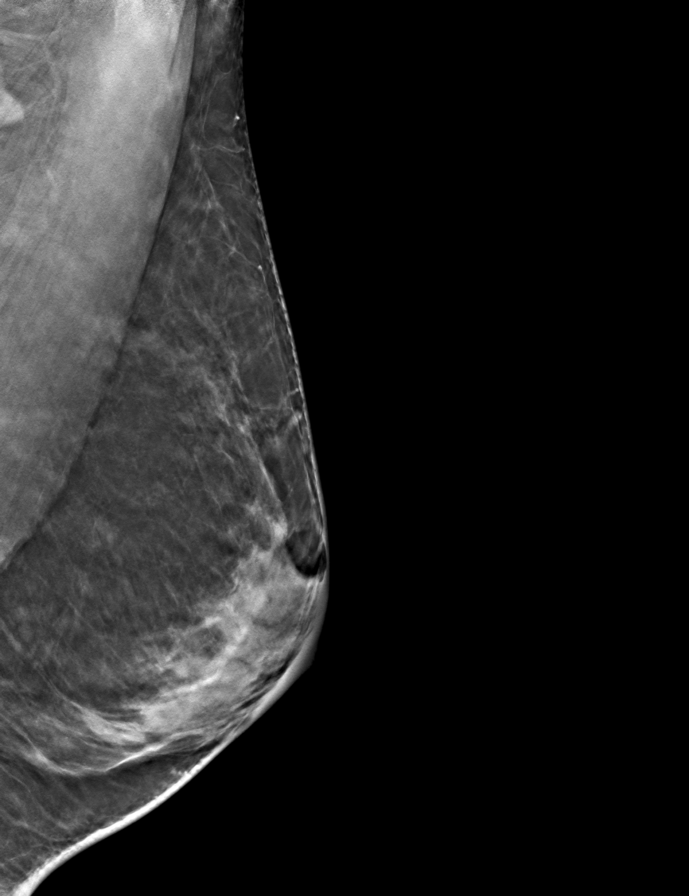

[9 of 24 positions shown; findings below may reference images not displayed]

ACR Breast Density Category c: The breast tissue is heterogeneously
dense, which may obscure small masses.
FINDINGS: There are no findings suspicious for malignancy.
IMPRESSION: No mammographic evidence of malignancy. A result letter of this
screening mammogram will be mailed directly to the patient.

RECOMMENDATION:
Screening mammogram in one year. (Code:Q3-W-BC3)

BI-RADS CATEGORY  1: Negative.

## 2022-11-26 ENCOUNTER — Other Ambulatory Visit: Payer: Self-pay | Admitting: Internal Medicine

## 2022-11-26 DIAGNOSIS — K580 Irritable bowel syndrome with diarrhea: Secondary | ICD-10-CM

## 2022-11-26 DIAGNOSIS — R197 Diarrhea, unspecified: Secondary | ICD-10-CM

## 2022-11-26 DIAGNOSIS — K219 Gastro-esophageal reflux disease without esophagitis: Secondary | ICD-10-CM

## 2022-12-04 NOTE — Progress Notes (Unsigned)
Referring Provider: [facility], [facility] Primary Care Physician:  Lesle Reek, MD Primary GI Physician: Dr. Jena Gauss  No chief complaint on file.   HPI:   Gabrielle Scott is a 47 y.o. 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  Last seen in the office 06/07/2022.  She reported intermittent looser stools 2-3 times a week with fair results with Levsin.  Some days, having harder stools or may skip a day without a bowel movement which was suspected to be related to Levsin use.  Advised that she could continue to use Levsin as needed and let us know if she develops any persistent, uncontrolled diarrhea.  GERD was well-controlled on Dexilant.  Recommended 29-month follow-up.  Today:   Past Medical History:  Diagnosis Date   Anal fissure    Anxiety    Benign neoplasm of stomach    Constipation    Elevated serum creatinine    Folliculitis    GERD (gastroesophageal reflux disease)    Hemorrhoids    Hypertension    Overweight    Palpitations    Prediabetes    Schizoaffective disorder (HCC)    Tachycardia    White coat syndrome with diagnosis of hypertension     Past Surgical History:  Procedure Laterality Date   BIOPSY  07/23/2019   Procedure: BIOPSY;  Surgeon: Corbin Ade, MD;  [facility];  Service: Endoscopy;;  gastric polyps   BREAST LUMPECTOMY Left    Cannot remember date; non-cancerous   COLONOSCOPY WITH PROPOFOL N/A 06/09/2017   Surgeon: Corbin Ade, MD; internal hemorrhoids.  Recommended repeat in 10 years.   ESOPHAGOGASTRODUODENOSCOPY (EGD) WITH PROPOFOL N/A 07/23/2019   Procedure: ESOPHAGOGASTRODUODENOSCOPY (EGD) WITH PROPOFOL;  Surgeon: Corbin Ade, MD;  [facility];   Service: Endoscopy;  Laterality: N/A;  2:30pm   gastric polypectomy  2013   [facility]   WISDOM TOOTH EXTRACTION      Current Outpatient Medications  Medication Sig Dispense Refill   CALCIUM 600/VITAMIN D3 600-800 MG-UNIT TABS Take 1 tablet by mouth in the morning and at bedtime.     cetirizine (ZYRTEC) 10 MG tablet Take 10 mg by mouth daily.     cyclobenzaprine (FLEXERIL) 5 MG tablet 1 tablet Orally every 8 hours for 30 days     dexlansoprazole (DEXILANT) 60 MG capsule Take 1 capsule (60 mg total) by mouth daily before breakfast. 90 capsule 3   hyoscyamine (LEVSIN SL) 0.125 MG SL tablet PLACE 1 TABLET UNDER THE TONGUE EVERY 4 HOURS AS NEEDED. 40 tablet 3   LORazepam (ATIVAN) 0.5 MG tablet Take 0.5 mg by mouth 2 (two) times daily as needed for anxiety.      losartan-hydrochlorothiazide (HYZAAR) 50-12.5 MG tablet Take 1 tablet by mouth daily.     MedroxyPROGESTERone Acetate (DEPO-PROVERA IM) Inject into the muscle every 3 (three) months.     metoprolol succinate (TOPROL-XL) 100 MG 24 hr tablet Take 100 mg by mouth daily. Take with or immediately following a meal.     QUEtiapine (SEROQUEL) 25 MG tablet Take 100 mg by mouth at bedtime.     RISPERDAL CONSTA 50 MG injection Inject 50 mg into the muscle every 14 (fourteen) days.     No current facility-administered medications for  this visit.    Allergies as of 12/06/2022   (No Known Allergies)    Family History  Problem Relation Age of Onset   Colon cancer Neg Hx    Gastric cancer Neg Hx    Esophageal cancer Neg Hx    Inflammatory bowel disease Neg Hx     Social History   Socioeconomic History   Marital status: Single    Spouse name: Not on file   Number of children: Not on file   Years of education: Not on file   Highest education level: Not on file  Occupational History   Not on file  Tobacco Use   Smoking status: Never   Smokeless tobacco: Never  Vaping Use   Vaping status: Never Used  Substance and Sexual Activity    Alcohol use: No   Drug use: No   Sexual activity: Yes    Birth control/protection: Injection  Other Topics Concern   Not on file  Social History Narrative   Not on file   Social Determinants of Health   Financial Resource Strain: Not on file  Food Insecurity: Not on file  Transportation Needs: Not on file  Physical Activity: Not on file  Stress: Not on file  Social Connections: Not on file    Review of Systems: Gen: Denies fever, chills, anorexia. Denies fatigue, weakness, weight loss.  CV: Denies chest pain, palpitations, syncope, peripheral edema, and claudication. Resp: Denies dyspnea at rest, cough, wheezing, coughing up blood, and pleurisy. GI: Denies vomiting blood, jaundice, and fecal incontinence.   Denies dysphagia or odynophagia. Derm: Denies rash, itching, dry skin Psych: Denies depression, anxiety, memory loss, confusion. No homicidal or suicidal ideation.  Heme: Denies bruising, bleeding, and enlarged lymph nodes.  Physical Exam: There were no vitals taken for this visit. General:   Alert and oriented. No distress noted. Pleasant and cooperative.  Head:  Normocephalic and atraumatic. Eyes:  Conjuctiva clear without scleral icterus. Heart:  S1, S2 present without murmurs appreciated. Lungs:  Clear to auscultation bilaterally. No wheezes, rales, or rhonchi. No distress.  Abdomen:  +BS, soft, non-tender and non-distended. No rebound or guarding. No HSM or masses noted. Msk:  Symmetrical without gross deformities. Normal posture. Extremities:  Without edema. Neurologic:  Alert and  oriented x4 Psych:  Normal mood and affect.    Assessment:     Plan:  ***   Ermalinda Memos, PA-C [facility] 12/06/2022

## 2022-12-06 ENCOUNTER — Ambulatory Visit (INDEPENDENT_AMBULATORY_CARE_PROVIDER_SITE_OTHER): Payer: MEDICAID | Admitting: Gastroenterology

## 2022-12-06 ENCOUNTER — Encounter: Payer: Self-pay | Admitting: Gastroenterology

## 2022-12-06 VITALS — BP 135/94 | HR 82 | Temp 98.4°F | Ht 67.0 in | Wt 156.8 lb

## 2022-12-06 DIAGNOSIS — K580 Irritable bowel syndrome with diarrhea: Secondary | ICD-10-CM | POA: Diagnosis not present

## 2022-12-06 DIAGNOSIS — K219 Gastro-esophageal reflux disease without esophagitis: Secondary | ICD-10-CM | POA: Diagnosis not present

## 2022-12-06 DIAGNOSIS — R197 Diarrhea, unspecified: Secondary | ICD-10-CM | POA: Diagnosis not present

## 2022-12-06 MED ORDER — HYOSCYAMINE SULFATE 0.125 MG SL SUBL: SUBLINGUAL_TABLET | SUBLINGUAL | 5 refills | Status: DC

## 2022-12-06 MED ORDER — DEXLANSOPRAZOLE 60 MG PO CPDR: 60.0000 mg | DELAYED_RELEASE_CAPSULE | Freq: Every day | ORAL | 11 refills | Status: DC

## 2022-12-06 NOTE — Patient Instructions (Signed)
Continue Dexilant 60 mg daily at least 30 minutes before breakfast.  I recommend that you avoid fried, fatty, greasy foods first thing in the morning as this is likely contributing to nausea/reflux.  You can use Pepcid over-the-counter as needed for breakthrough heartburn.  You could try taking this before consuming a known dietary triggers such as pizza or spaghetti.  However, in general, you should follow a GERD diet/lifestyle: Avoid fried, fatty, greasy, spicy, citrus foods. Avoid caffeine and carbonated beverages. Avoid chocolate. Try eating 4-6 small meals a day rather than 3 large meals. Do not eat within 3 hours of laying down. Prop head of bed up on wood or bricks to create a 6 inch incline.   Try increasing Levsin to twice daily to help with loose stools and abdominal cramping.  Hold in the setting of constipation.  Will plan to follow-up with you in 6 months or sooner if needed.  It was good to see you today!   Ermalinda Memos, PA-C [facility]

## 2022-12-25 ENCOUNTER — Emergency Department (HOSPITAL_COMMUNITY): Payer: MEDICAID

## 2022-12-25 ENCOUNTER — Emergency Department (HOSPITAL_COMMUNITY)
Admission: EM | Admit: 2022-12-25 | Discharge: 2022-12-25 | Disposition: A | Discharge status: Home / Self Care | Payer: MEDICAID | Attending: Emergency Medicine | Admitting: Emergency Medicine

## 2022-12-25 ENCOUNTER — Encounter (HOSPITAL_COMMUNITY): Payer: Self-pay | Admitting: *Deleted

## 2022-12-25 ENCOUNTER — Other Ambulatory Visit: Payer: Self-pay

## 2022-12-25 DIAGNOSIS — R413 Other amnesia: Secondary | ICD-10-CM | POA: Diagnosis not present

## 2022-12-25 DIAGNOSIS — Z79899 Other long term (current) drug therapy: Secondary | ICD-10-CM | POA: Diagnosis not present

## 2022-12-25 DIAGNOSIS — E876 Hypokalemia: Secondary | ICD-10-CM | POA: Diagnosis not present

## 2022-12-25 DIAGNOSIS — I10 Essential (primary) hypertension: Secondary | ICD-10-CM | POA: Insufficient documentation

## 2022-12-25 DIAGNOSIS — R030 Elevated blood-pressure reading, without diagnosis of hypertension: Secondary | ICD-10-CM

## 2022-12-25 LAB — CBC
HCT: 37.9 % (ref 36.0–46.0)
Hemoglobin: 12.1 g/dL (ref 12.0–15.0)
MCH: 27 pg (ref 26.0–34.0)
MCHC: 31.9 g/dL (ref 30.0–36.0)
MCV: 84.6 fL (ref 80.0–100.0)
Platelets: 199 10*3/uL (ref 150–400)
RBC: 4.48 MIL/uL (ref 3.87–5.11)
RDW: 13.1 % (ref 11.5–15.5)
WBC: 4.7 10*3/uL (ref 4.0–10.5)
nRBC: 0 % (ref 0.0–0.2)

## 2022-12-25 LAB — COMPREHENSIVE METABOLIC PANEL
ALT: 11 U/L (ref 0–44)
AST: 12 U/L — ABNORMAL LOW (ref 15–41)
Albumin: 3.4 g/dL — ABNORMAL LOW (ref 3.5–5.0)
Alkaline Phosphatase: 55 U/L (ref 38–126)
Anion gap: 9 (ref 5–15)
BUN: 6 mg/dL (ref 6–20)
CO2: 25 mmol/L (ref 22–32)
Calcium: 8.8 mg/dL — ABNORMAL LOW (ref 8.9–10.3)
Chloride: 103 mmol/L (ref 98–111)
Creatinine, Ser: 0.81 mg/dL (ref 0.44–1.00)
GFR, Estimated: >60 mL/min (ref >60)
Glucose, Bld: 95 mg/dL (ref 70–99)
Potassium: 3.1 mmol/L — ABNORMAL LOW (ref 3.5–5.1)
Sodium: 137 mmol/L (ref 135–145)
Total Bilirubin: 0.4 mg/dL (ref 0.3–1.2)
Total Protein: 7 g/dL (ref 6.5–8.1)

## 2022-12-25 LAB — URINALYSIS, ROUTINE W REFLEX MICROSCOPIC
Bilirubin Urine: NEGATIVE
Glucose, UA: NEGATIVE mg/dL
Ketones, ur: NEGATIVE mg/dL
Leukocytes,Ua: NEGATIVE
Nitrite: NEGATIVE
Protein, ur: NEGATIVE mg/dL
Specific Gravity, Urine: 1.005 (ref 1.005–1.030)
pH: 7 (ref 5.0–8.0)

## 2022-12-25 LAB — MAGNESIUM: Magnesium: 1.7 mg/dL (ref 1.7–2.4)

## 2022-12-25 LAB — PREGNANCY, URINE: Preg Test, Ur: NEGATIVE

## 2022-12-25 MED ORDER — POTASSIUM CHLORIDE CRYS ER 20 MEQ PO TBCR: 20.0000 meq | EXTENDED_RELEASE_TABLET | Freq: Every day | ORAL | 0 refills | Status: DC

## 2022-12-25 MED ORDER — POTASSIUM CHLORIDE CRYS ER 20 MEQ PO TBCR
40.0000 meq | EXTENDED_RELEASE_TABLET | Freq: Once | ORAL | Status: AC
  Administered 2022-12-25: 40 meq via ORAL
  Filled 2022-12-25: qty 2

## 2022-12-25 NOTE — ED Notes (Addendum)
Introduced self to pt Pt walked fro VT2 to Room 3 on own power without assistance Pt stated she has been feeling weak and her heart has been racing for a couple of days. Denies CP and SOB Placed on monitor Completed 12 lead NSR rate in 70's  Vitals listed  Labs completed in triage

## 2022-12-25 NOTE — ED Provider Notes (Signed)
[place] EMERGENCY DEPARTMENT AT [facility] Provider Note   CSN: [id number] Arrival date & time: 12/25/22  1309     History  Chief Complaint  Patient presents with   Altered Mental Status    Gabrielle Scott is a 47 y.o. female.   Altered Mental Status   47 year old female presents emergency department with complaints of elevated blood pressure, tachycardia, memory abnormality.  Patient states for the past 2 to 3 days, has felt like her heart rate has been elevated.  States she went to the local pharmacy a couple times and was found with her blood pressure elevated in the 150s systolic with a heart rate of 409/811.  States that she has a history of hypertension is on antihypertensive medications of which she has been compliant.  Regarding memory abnormality, patient states that she had 1 episode of forgetting how to play a card game temporarily the last for a matter of minutes before coming back to her.  Denies any weakness/sensory deficits in upper extremities, slurred speech, facial droop, gait abnormalities.  Denies any fever, chills, chest pain, shortness of breath, abdominal pain, nausea, vomiting, urinary symptoms, change in bowel habits.  Past medical history significant for schizoaffective disorder on Seroquel, GERD, palpitations, hypertension, gastric nodule  Home Medications Prior to Admission medications   Medication Sig Start Date End Date Taking? Authorizing Provider  potassium chloride SA (KLOR-CON M) 20 MEQ tablet Take 1 tablet (20 mEq total) by mouth daily. 12/26/22  Yes Sherian Maroon A, PA  CALCIUM 600/VITAMIN D3 600-800 MG-UNIT TABS Take 1 tablet by mouth in the morning and at bedtime. 05/22/19   [provider]  cetirizine (ZYRTEC) 10 MG tablet Take 10 mg by mouth daily.    [provider]  cyclobenzaprine (FLEXERIL) 5 MG tablet 1 tablet Orally every 8 hours for 30 days 05/26/22   [provider]  dexlansoprazole (DEXILANT) 60 MG  capsule Take 1 capsule (60 mg total) by mouth daily. 12/06/22   Letta Median, PA-C  hydrocortisone (ANUSOL-HC) 2.5 % rectal cream 1 application Externally Twice a day for 7 days As needed 08/30/22   [provider]  hyoscyamine (LEVSIN SL) 0.125 MG SL tablet PLACE 1 TABLET UNDER THE TONGUE EVERY 4 HOURS AS NEEDED. 12/06/22   Letta Median, PA-C  LORazepam (ATIVAN) 0.5 MG tablet Take 0.5 mg by mouth 2 (two) times daily as needed for anxiety.     [provider]  losartan-hydrochlorothiazide (HYZAAR) 50-12.5 MG tablet Take 1 tablet by mouth daily.    [provider]  MedroxyPROGESTERone Acetate (DEPO-PROVERA IM) Inject into the muscle every 3 (three) months.    [provider]  metoprolol succinate (TOPROL-XL) 100 MG 24 hr tablet Take 100 mg by mouth daily. Take with or immediately following a meal.    [provider]  QUEtiapine (SEROQUEL) 25 MG tablet Take 100 mg by mouth at bedtime.    [provider]  RISPERDAL CONSTA 50 MG injection Inject 50 mg into the muscle every 14 (fourteen) days. 06/25/19   [provider]      Allergies    Patient has no known allergies.    Review of Systems   Review of Systems  All other systems reviewed and are negative.   Physical Exam Updated Vital Signs BP (!) 133/95   Pulse 66   Temp 97.8 F (36.6 C) (Oral)   Resp 13   Ht 5\' 7"  (1.702 m)   Wt 70.8 kg  LMP  (LMP Unknown)   SpO2 99%   BMI 24.43 kg/m  Physical Exam Vitals and nursing note reviewed.  Constitutional:      General: She is not in acute distress.    Appearance: She is well-developed.  HENT:     Head: Normocephalic and atraumatic.  Eyes:     Conjunctiva/sclera: Conjunctivae normal.  Cardiovascular:     Rate and Rhythm: Normal rate and regular rhythm.     Heart sounds: No murmur heard. Pulmonary:     Effort: Pulmonary effort is normal. No respiratory distress.     Breath sounds: Normal breath sounds.  Abdominal:      Palpations: Abdomen is soft.     Tenderness: There is no abdominal tenderness.  Musculoskeletal:        General: No swelling.     Cervical back: Neck supple.  Skin:    General: Skin is warm and dry.     Capillary Refill: Capillary refill takes less than 2 seconds.  Neurological:     Mental Status: She is alert.     Comments: Alert and oriented to self, place, time and event.  Patient able to remember 3 words ball, rat, car after 30+ minutes.   Speech is fluent, clear without dysarthria or dysphasia.   Strength 5/5 in upper/lower extremities   Sensation intact in upper/lower extremities   Normal gait.  CN I not tested  CN II grossly intact visual fields bilaterally. Did not visualize posterior eye.  CN III, IV, VI PERRLA and EOMs intact bilaterally  CN V Intact sensation to sharp and light touch to the face  CN VII facial movements symmetric  CN VIII not tested  CN IX, X no uvula deviation, symmetric rise of soft palate  CN XI 5/5 SCM and trapezius strength bilaterally  CN XII Midline tongue protrusion, symmetric L/R movements     Psychiatric:        Mood and Affect: Mood normal.     ED Results / Procedures / Treatments   Labs (all labs ordered are listed, but only abnormal results are displayed) Labs Reviewed  COMPREHENSIVE METABOLIC PANEL - Abnormal; Notable for the following components:      Result Value   Potassium 3.1 (*)    Calcium 8.8 (*)    Albumin 3.4 (*)    AST 12 (*)    All other components within normal limits  URINALYSIS, ROUTINE W REFLEX MICROSCOPIC - Abnormal; Notable for the following components:   Color, Urine STRAW (*)    Hgb urine dipstick SMALL (*)    Bacteria, UA FEW (*)    All other components within normal limits  CBC  PREGNANCY, URINE  MAGNESIUM    EKG None  Radiology CT Head Wo Contrast  Result Date: 12/25/2022 CLINICAL DATA:  Memory loss, confusion. EXAM: CT HEAD WITHOUT CONTRAST TECHNIQUE: Contiguous axial images were  obtained from the base of the skull through the vertex without intravenous contrast. RADIATION DOSE REDUCTION: This exam was performed according to the departmental dose-optimization program which includes automated exposure control, adjustment of the mA and/or kV according to patient size and/or use of iterative reconstruction technique. COMPARISON:  None Available. FINDINGS: Brain: No evidence of acute infarction, hemorrhage, hydrocephalus, extra-axial collection or mass lesion/mass effect. Vascular: No hyperdense vessel or unexpected calcification. Skull: Normal. Negative for fracture or focal lesion. Sinuses/Orbits: No acute finding. Other: None. IMPRESSION: No acute intracranial pathology. Electronically Signed   By: Romona Curls M.D.   On: 12/25/2022 15:41  Procedures Procedures    Medications Ordered in ED Medications  potassium chloride SA (KLOR-CON M) CR tablet 40 mEq (40 mEq Oral Given 12/25/22 1529)    ED Course/ Medical Decision Making/ A&P                                 Medical Decision Making Amount and/or Complexity of Data Reviewed Labs: ordered. Radiology: ordered.  Risk Prescription drug management.   This patient presents to the ED for concern of hypertension, memory issue, this involves an extensive number of treatment options, and is a complaint that carries with it a high risk of complications and morbidity.  The differential diagnosis includes CVA, metabolic derangement, UA, infectious source, hypertension, other   Co morbidities that complicate the patient evaluation  See HPI   Additional history obtained:  Additional history obtained from EMR External records from outside source obtained and reviewed including hospital records   Lab Tests:  I Ordered, and personally interpreted labs.  The pertinent results include: No leukocytosis.  No evidence of anemia.  Placed within range.  Hypokalemia and hypocalcemia with 3.18.8 respectively but otherwise, no  electrolyte abnormalities.  No transaminitis.  No renal dysfunction.  UA significant for few bacteria, small hemoglobin otherwise unremarkable.   Imaging Studies ordered:  I ordered imaging studies including CT head I independently visualized and interpreted imaging which showed no acute intracranial abnormality I agree with the radiologist interpretation   Cardiac Monitoring: / EKG:  The patient was maintained on a cardiac monitor.  I personally viewed and interpreted the cardiac monitored which showed an underlying rhythm of: Sinus rhythm with nonspecific T wave changes   Consultations Obtained:  N/a   Problem List / ED Course / Critical interventions / Medication management  Elevated blood pressure reading, hypokalemia, memory issue I ordered medication including potassium chloride   Reevaluation of the patient after these medicines showed that the patient improved I have reviewed the patients home medicines and have made adjustments as needed   Social Determinants of Health:  Denies tobacco, illicit drug use   Test / Admission - Considered:  Elevated blood pressure reading, hypokalemia, memory issue Vitals signs within normal range and stable throughout visit. Laboratory/imaging studies significant for: See above 47 year old female presents emergency department with complaints of elevated blood pressure readings, increased heart rate as well as incidence of memory issue while in the outpatient setting.  Regarding elevated blood pressure readings, did have reported blood pressures in the 150s when she went to a local pharmacy over the past 2 to 3 days as well as heart rate readings in the 100s.  Throughout ED visit, blood pressures in the 130s/120s systolic.  Will refrain from altering patient's at home antihypertensive medications but recommend continued lifestyle modification as well as logging blood pressure and follow-up with primary care.  Regarding memory issue, seem to  be isolated to not being able to remember how to place her in card game but was temporary.  No other additional episodes.  Neurologic exam nonfocal.  Patient able to remember list of words multiple times on the ED and seems to be orientated to person, place, time and event.  Described event rather vague and seems not to be related to CVA.  Labs unremarkable for any acute metabolic derangement/infectious process.  Recommend follow-up with primary care and check return precautions if any return/worsening of symptoms concerning for stroke.  Treatment plan discussed at length  with patient and she acknowledged understanding was agreeable to said plan.  Patient overall well-appearing, febrile no acute distress. Worrisome signs and symptoms were discussed with the patient, and the patient acknowledged understanding to return to the ED if noticed. Patient was stable upon discharge.          Final Clinical Impression(s) / ED Diagnoses Final diagnoses:  Hypokalemia  Elevated blood pressure reading  Memory problem    Rx / DC Orders ED Discharge Orders          Ordered    potassium chloride SA (KLOR-CON M) 20 MEQ tablet  Daily        12/25/22 1552              Peter Garter, [place] 12/25/22 1607    Gloris Manchester, MD 12/25/22 [id number]

## 2022-12-25 NOTE — ED Triage Notes (Signed)
Pt states her HR and BP were elevated few days ago with feeling confused.  Pt states she still feels a little confused.  Pt alert and oriented to month, year and to president. Denies any pain. Denies any numbness or weakness.

## 2022-12-25 NOTE — Discharge Instructions (Addendum)
As discussed, workup today overall reassuring.  Laboratory studies did show slightly low potassium which we will replace orally the next few days.  CT scan did not show any signs of abnormalities.  Recommend follow-up with primary care for reassessment of your symptoms.  Continue to monitor blood pressure at home but given normal readings while in the emergency department, will not change her blood pressure medications.  Please do not hesitate to return to emergency department if there are worrisome signs and symptoms we discussed become apparent.

## 2023-04-21 ENCOUNTER — Encounter (HOSPITAL_COMMUNITY): Payer: Self-pay

## 2023-04-21 ENCOUNTER — Emergency Department (HOSPITAL_COMMUNITY): Payer: MEDICAID

## 2023-04-21 ENCOUNTER — Other Ambulatory Visit: Payer: Self-pay

## 2023-04-21 ENCOUNTER — Emergency Department (HOSPITAL_COMMUNITY)
Admission: EM | Admit: 2023-04-21 | Discharge: 2023-04-21 | Disposition: A | Discharge status: Home / Self Care | Payer: MEDICAID

## 2023-04-21 DIAGNOSIS — I10 Essential (primary) hypertension: Secondary | ICD-10-CM | POA: Diagnosis present

## 2023-04-21 DIAGNOSIS — R519 Headache, unspecified: Secondary | ICD-10-CM | POA: Diagnosis present

## 2023-04-21 DIAGNOSIS — Z79899 Other long term (current) drug therapy: Secondary | ICD-10-CM | POA: Insufficient documentation

## 2023-04-21 LAB — BASIC METABOLIC PANEL
Anion gap: 11 (ref 5–15)
BUN: 9 mg/dL (ref 6–20)
CO2: 25 mmol/L (ref 22–32)
Calcium: 9.5 mg/dL (ref 8.9–10.3)
Chloride: 101 mmol/L (ref 98–111)
Creatinine, Ser: 0.94 mg/dL (ref 0.44–1.00)
GFR, Estimated: >60 mL/min (ref >60)
Glucose, Bld: 89 mg/dL (ref 70–99)
Potassium: 3.6 mmol/L (ref 3.5–5.1)
Sodium: 137 mmol/L (ref 135–145)

## 2023-04-21 LAB — CBC
HCT: 41.4 % (ref 36.0–46.0)
Hemoglobin: 13 g/dL (ref 12.0–15.0)
MCH: 26.9 pg (ref 26.0–34.0)
MCHC: 31.4 g/dL (ref 30.0–36.0)
MCV: 85.7 fL (ref 80.0–100.0)
Platelets: 206 10*3/uL (ref 150–400)
RBC: 4.83 MIL/uL (ref 3.87–5.11)
RDW: 12.6 % (ref 11.5–15.5)
WBC: 5 10*3/uL (ref 4.0–10.5)
nRBC: 0 % (ref 0.0–0.2)

## 2023-04-21 NOTE — ED Triage Notes (Signed)
Pt arrived via POV c/o hypertension. Pt reports she called 911 to check her BP. Pt reports EMS obtained a reading of 180/100 and endorses a headache.

## 2023-04-21 NOTE — ED Provider Notes (Signed)
[place] EMERGENCY DEPARTMENT AT [facility] Provider Note   CSN: [id number] Arrival date & time: 04/21/23  1035     History  Chief Complaint  Patient presents with   Hypertension    Gabrielle Scott is a 48 y.o. female with past medical history of GERD, hypertension, schizophrenia presenting to emergency room with complaint of high blood pressure.  Patient is taking losartan and metoprolol for blood pressure at home.  Patient reports she was just recently started on losartan approximately 1 week ago for hypertension.  Today she is denying headache, blurry vision change in vision, chest pain, abdominal pain nausea vomiting diarrhea.  Patient reports she took her blood pressure at home and it was 180/90 which made her very worried that she called EMS for blood pressure check.  Blood pressure was around the same with EMS as well.   Hypertension Pertinent negatives include no chest pain.       Home Medications Prior to Admission medications   Medication Sig Start Date End Date Taking? Authorizing Provider  CALCIUM 600/VITAMIN D3 600-800 MG-UNIT TABS Take 1 tablet by mouth in the morning and at bedtime. 05/22/19   [provider]  cetirizine (ZYRTEC) 10 MG tablet Take 10 mg by mouth daily.    [provider]  cyclobenzaprine (FLEXERIL) 5 MG tablet 1 tablet Orally every 8 hours for 30 days 05/26/22   [provider]  dexlansoprazole (DEXILANT) 60 MG capsule Take 1 capsule (60 mg total) by mouth daily. 12/06/22   Letta Median, PA-C  hydrocortisone (ANUSOL-HC) 2.5 % rectal cream 1 application Externally Twice a day for 7 days As needed 08/30/22   [provider]  hyoscyamine (LEVSIN SL) 0.125 MG SL tablet PLACE 1 TABLET UNDER THE TONGUE EVERY 4 HOURS AS NEEDED. 12/06/22   Letta Median, PA-C  LORazepam (ATIVAN) 0.5 MG tablet Take 0.5 mg by mouth 2 (two) times daily as needed for anxiety.     [provider]   losartan-hydrochlorothiazide (HYZAAR) 50-12.5 MG tablet Take 1 tablet by mouth daily.    [provider]  MedroxyPROGESTERone Acetate (DEPO-PROVERA IM) Inject into the muscle every 3 (three) months.    [provider]  metoprolol succinate (TOPROL-XL) 100 MG 24 hr tablet Take 100 mg by mouth daily. Take with or immediately following a meal.    [provider]  potassium chloride SA (KLOR-CON M) 20 MEQ tablet Take 1 tablet (20 mEq total) by mouth daily. 12/26/22   Peter Garter, PA  QUEtiapine (SEROQUEL) 25 MG tablet Take 100 mg by mouth at bedtime.    [provider]  RISPERDAL CONSTA 50 MG injection Inject 50 mg into the muscle every 14 (fourteen) days. 06/25/19   [provider]      Allergies    Patient has no known allergies.    Review of Systems   Review of Systems  Cardiovascular:  Negative for chest pain and leg swelling.    Physical Exam Updated Vital Signs BP (!) 160/97   Pulse 79   Temp 97.8 F (36.6 C) (Oral)   Resp 16   Ht 5\' 7"  (1.702 m)   Wt 70.8 kg   SpO2 98%   BMI 24.45 kg/m  Physical Exam Vitals and nursing note reviewed.  Constitutional:      General: She is not in acute distress.    Appearance: She is not toxic-appearing.  HENT:     Head: Normocephalic and atraumatic.  Eyes:  General: No scleral icterus.    Conjunctiva/sclera: Conjunctivae normal.  Cardiovascular:     Rate and Rhythm: Normal rate and regular rhythm.     Pulses: Normal pulses.     Heart sounds: Normal heart sounds.  Pulmonary:     Effort: Pulmonary effort is normal. No respiratory distress.     Breath sounds: Normal breath sounds.  Abdominal:     General: Abdomen is flat. Bowel sounds are normal.     Palpations: Abdomen is soft.     Tenderness: There is no abdominal tenderness.  Musculoskeletal:     Right lower leg: No edema.     Left lower leg: No edema.  Skin:    General: Skin is warm and dry.     Findings: No lesion.   Neurological:     General: No focal deficit present.     Mental Status: She is alert and oriented to person, place, and time. Mental status is at baseline.     ED Results / Procedures / Treatments   Labs (all labs ordered are listed, but only abnormal results are displayed) Labs Reviewed - No data to display  EKG None  Radiology No results found.  Procedures Procedures    Medications Ordered in ED Medications - No data to display  ED Course/ Medical Decision Making/ A&P                                 Medical Decision Making Amount and/or Complexity of Data Reviewed Labs: ordered. Radiology: ordered.   This patient presents to the ED for concern of hypertension, this involves an extensive number of treatment options, and is a complaint that carries with it a high risk of complications and morbidity.  The differential diagnosis includes hypertension, medication noncompliance, hypertensive emergency, hypertensive urgency   Problem List / ED Course / Critical interventions / Medication management  Patient reporting to the emergency room with high blood pressure reading that started today.  Patient reports she started new blood pressure medicine.  She does report mild headache.  She has no focal neurological deficits on exam strength equal bilaterally, sensation equal bilaterally.  She is alert oriented answering questions appropriately with no slurred speech and no ataxia or nystagmus.  Given that her blood pressure is within normal limits right now I will not give her any medication during stay.  Obtaining imaging to rule out intracranial abnormality.  Patient would like to hold off on headache management until after we have CT scan back. Patient's blood pressure improved throughout stay.  She was getting readings between 120-140 systolic.  She also just started a new blood pressure medication which she thinks is spironolactone.  Giving reassuring labs, imaging and blood  pressure readings throughout stay I do not feel that patient will need a another blood pressure medicine.  I will send home with blood pressure recording paperwork and have her follow-up with primary care in the meantime. Patient also reporting headache.  She has reassuring head CT here.  I did offer medications but upon reassessment patient reports her headache has resolved. I have reviewed the patients home medicines and have made adjustments as needed   Plan  Blood pressure checks at home Continue current medications Follow up with PCP          Final Clinical Impression(s) / ED Diagnoses Final diagnoses:  Hypertension, unspecified type  Bad headache    Rx / DC Orders  ED Discharge Orders     None         Raford Pitcher Evalee Jefferson 04/21/23 1940    Durwin Glaze, MD 04/21/23 2322

## 2023-04-21 NOTE — ED Notes (Signed)
Pt returned from CT °

## 2023-04-21 NOTE — Discharge Instructions (Addendum)
You were seen in the emergency room today for high blood pressure.  Labs and imaging are reassuring.  Because you do started a new blood pressure medication I will not add any new medicines today.  Continue your treatment prescribed by primary care.  I would recommend checking her blood pressure twice a day and calling primary care and readings as well as discussing the need for possible third blood pressure medicine.  Please return to emergency room if you have any new or worsening symptoms.

## 2023-05-12 ENCOUNTER — Telehealth: Payer: Self-pay | Admitting: *Deleted

## 2023-05-12 NOTE — Telephone Encounter (Signed)
Need a PA for Hyoscyamine Sulfate called pharmacy to find out why it would not go through Cover My Meds. They said pt has Medicare Part A and B but Medicare Part D will not start until the end of March. She will have to pay 22$  until then. I informed the pt. She stated she will have to wait until March to get the medication.

## 2023-05-17 NOTE — Telephone Encounter (Signed)
 Noted. She can try using over the counter IBGard, follow instructions on the package. She can also using imodium as needed until she gets Levsin in a couple of weeks.

## 2023-05-20 NOTE — Telephone Encounter (Signed)
 noted

## 2023-05-20 NOTE — Telephone Encounter (Signed)
 Pt was made aware and verbalized understanding.

## 2023-06-04 NOTE — Progress Notes (Unsigned)
 Referring Provider: Lesle Reek, MD Primary Care Physician:  Laveda Abbe, MD Primary GI Physician: Dr. Jena Gauss  Chief Complaint  Patient presents with   Follow-up    Follow up. No problems     HPI:   Gabrielle Scott is a 48 y.o. 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   Last seen in the office 12/06/2022.  GERD fairly well-controlled on Dexilant aside from when consuming trigger foods such as pizza or spaghetti.  She is having about 4 bowel movements per day with taking Levsin once a day.  Most bowel movements typically first thing in the morning after breakfast.  Abdominal pain prior to BMs that improved thereafter.  Recommended continuing Dexilant 60 mg daily, over-the-counter Pepcid as needed for breakthrough, increase Levsin twice a day and follow-up in 6 months.  Telephone note 05/12/2023 stating patient was having to wait until March to get Levsin as Medicare part D did not start until the end of March.  Recommended using over-the-counter IBgard and Imodium as needed.  Today: GERD:  Doing well on Dexilant 60 mg daily.  IBS: Just ran out of Levsin so used imodium this morning which worked fairly well. Was taking Levsin once a day and having 2-3 loose Bms per day. Some lower abdominal cramping prior to a BM, but not too much with Levsin. Occasional scan rectal bleeding, overall rare. Hasn't had to use anything for hemorrhoids in a while. Not interested in hemorrhoid banding.     Past Medical History:  Diagnosis Date   Anal fissure    Anxiety    Benign neoplasm of stomach    Constipation    Elevated serum creatinine    Folliculitis    GERD (gastroesophageal reflux disease)    Hemorrhoids    Hypertension     Overweight    Palpitations    Prediabetes    Schizoaffective disorder (HCC)    Tachycardia    White coat syndrome with diagnosis of hypertension     Past Surgical History:  Procedure Laterality Date   BIOPSY  07/23/2019   Procedure: BIOPSY;  Surgeon: Corbin Ade, MD;  Location: [facility];  Service: Endoscopy;;  gastric polyps   BREAST LUMPECTOMY Left    Cannot remember date; non-cancerous   COLONOSCOPY WITH PROPOFOL N/A 06/09/2017   Surgeon: Corbin Ade, MD; internal hemorrhoids.  Recommended repeat in 10 years.   ESOPHAGOGASTRODUODENOSCOPY (EGD) WITH PROPOFOL N/A 07/23/2019   Procedure: ESOPHAGOGASTRODUODENOSCOPY (EGD) WITH PROPOFOL;  Surgeon: Corbin Ade, MD;  Location: [facility];  Service: Endoscopy;  Laterality: N/A;  2:30pm   gastric polypectomy  2013   [facility]   WISDOM TOOTH EXTRACTION      Current Outpatient Medications  Medication Sig Dispense Refill   amLODipine (NORVASC) 5 MG tablet Take 5 mg by mouth daily.     CALCIUM 600/VITAMIN D3 600-800 MG-UNIT TABS Take 1 tablet by mouth in the morning and at bedtime.     cetirizine (ZYRTEC) 10 MG tablet Take 10 mg by mouth daily.     cyclobenzaprine (FLEXERIL) 5 MG tablet 1 tablet Orally every 8 hours for 30 days     hyoscyamine (LEVSIN SL) 0.125 MG SL tablet PLACE 1 TABLET UNDER THE TONGUE EVERY 4 HOURS AS NEEDED. 60  tablet 5   LORazepam (ATIVAN) 0.5 MG tablet Take 0.5 mg by mouth 2 (two) times daily as needed for anxiety.      losartan (COZAAR) 100 MG tablet Oral for 30 Days     MedroxyPROGESTERone Acetate (DEPO-PROVERA IM) Inject into the muscle every 3 (three) months.     metoprolol succinate (TOPROL-XL) 100 MG 24 hr tablet Take 100 mg by mouth daily. Take with or immediately following a meal.     QUEtiapine (SEROQUEL) 25 MG tablet Take 100 mg by mouth at bedtime.     RISPERDAL CONSTA 50 MG injection Inject 50 mg into the muscle every 14 (fourteen) days.     dexlansoprazole (DEXILANT) 60 MG capsule Take 1  capsule (60 mg total) by mouth daily. 30 capsule 11   hydrocortisone (ANUSOL-HC) 2.5 % rectal cream Place rectally 2 (two) times daily. 30 g 1   No current facility-administered medications for this visit.    Allergies as of 06/06/2023   (No Known Allergies)    Family History  Problem Relation Age of Onset   Colon cancer Neg Hx    Gastric cancer Neg Hx    Esophageal cancer Neg Hx    Inflammatory bowel disease Neg Hx     Social History   Socioeconomic History   Marital status: Single    Spouse name: Not on file   Number of children: Not on file   Years of education: Not on file   Highest education level: Not on file  Occupational History   Not on file  Tobacco Use   Smoking status: Never   Smokeless tobacco: Never  Vaping Use   Vaping status: Never Used  Substance and Sexual Activity   Alcohol use: No   Drug use: No   Sexual activity: Yes    Birth control/protection: Injection  Other Topics Concern   Not on file  Social History Narrative   Not on file   Social Drivers of Health   Financial Resource Strain: Not on file  Food Insecurity: Not on file  Transportation Needs: Not on file  Physical Activity: Not on file  Stress: Not on file  Social Connections: Not on file    Review of Systems: Gen: Denies fever, chills, cold or flulike symptoms,  presyncope, syncope GI: See HPI Heme: See HPI  Physical Exam: BP 137/83 (BP Location: Right Arm, Patient Position: Sitting, Cuff Size: Large)   Pulse (!) 103   Temp 98 F (36.7 C) (Temporal)   Ht 5\' 6"  (1.676 m)   Wt 155 lb 9.6 oz (70.6 kg)   BMI 25.11 kg/m  General:  Alert and oriented. No distress noted. Pleasant and cooperative.  Head:  Normocephalic and atraumatic. Eyes:  Conjuctiva clear without scleral icterus. Abdomen:  +BS, soft, non-tender and non-distended. No rebound or guarding. No HSM or masses noted. Msk:  Symmetrical without gross deformities. Normal posture. Extremities:  Without  edema. Neurologic:  Alert and  oriented x4 Psych:  Normal mood and affect.    Assessment:  48 y.o. 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   GERD: Well-controlled on Dexilant 60 mg daily.  IBS: Has been fairly well-controlled for Levsin, but just ran out and is not able to obtain a refill until later part of March as  this is when Medicare part D starts. She took imodium this morning which worked fairly well for her. No alarm symptoms.   Hemorrhoids/rectal bleeding: Known history of hemorrhoids noted on last colonoscopy in 2019 which was performed for hematochezia.  Reports occasional scant toilet tissue hematochezia and is asking for refill of Anusol rectal cream to have on hand though she has not needed this in quite some time.   Plan:  Okay to continue to use Imodium as needed until able to obtain Levsin at the end of March. Hold Imodium if she becomes constipated. May also use Ibgard.  Continue Dexilant 60 mg daily. Anusol rectal cream as needed. Follow-up in 6 months or sooner if needed.   Ermalinda Memos, PA-C [facility] 06/06/2023

## 2023-06-06 ENCOUNTER — Encounter: Payer: Self-pay | Admitting: Gastroenterology

## 2023-06-06 ENCOUNTER — Ambulatory Visit (INDEPENDENT_AMBULATORY_CARE_PROVIDER_SITE_OTHER): Payer: No Typology Code available for payment source | Admitting: Gastroenterology

## 2023-06-06 VITALS — BP 137/83 | HR 103 | Temp 98.0°F | Ht 66.0 in | Wt 155.6 lb

## 2023-06-06 DIAGNOSIS — K589 Irritable bowel syndrome without diarrhea: Secondary | ICD-10-CM

## 2023-06-06 DIAGNOSIS — K649 Unspecified hemorrhoids: Secondary | ICD-10-CM | POA: Diagnosis not present

## 2023-06-06 DIAGNOSIS — K580 Irritable bowel syndrome with diarrhea: Secondary | ICD-10-CM

## 2023-06-06 DIAGNOSIS — K625 Hemorrhage of anus and rectum: Secondary | ICD-10-CM

## 2023-06-06 DIAGNOSIS — K219 Gastro-esophageal reflux disease without esophagitis: Secondary | ICD-10-CM | POA: Diagnosis not present

## 2023-06-06 MED ORDER — DEXLANSOPRAZOLE 60 MG PO CPDR: 60.0000 mg | DELAYED_RELEASE_CAPSULE | Freq: Every day | ORAL | 11 refills | Status: DC

## 2023-06-06 MED ORDER — HYDROCORTISONE (PERIANAL) 2.5 % EX CREA
TOPICAL_CREAM | Freq: Two times a day (BID) | CUTANEOUS | 1 refills | Status: DC
Start: 2023-06-06 — End: 2023-11-14

## 2023-06-06 NOTE — Patient Instructions (Signed)
 As we discussed, you may continue to use Imodium as needed for diarrhea until you are able to get Levsin at the end of March..  If you have any trouble obtaining Levsin by the end of March, please let me know.  If you develop constipation while you are taking Imodium, hold Imodium.  You can also use Ibgard daily.  This is something that you can pick up over-the-counter and will also likely help with your diarrhea and abdominal cramping.  You can follow the instructions on the container.    Continue to use Anusol rectal cream as needed for hemorrhoid symptoms.  Continue Dexilant 60 mg daily.  I will plan to see back in 6 months or sooner if needed.  Ermalinda Memos, PA-C [facility]

## 2023-08-08 ENCOUNTER — Other Ambulatory Visit (HOSPITAL_COMMUNITY): Payer: Self-pay | Admitting: Internal Medicine

## 2023-08-08 DIAGNOSIS — Z1231 Encounter for screening mammogram for malignant neoplasm of breast: Secondary | ICD-10-CM

## 2023-08-29 ENCOUNTER — Ambulatory Visit (HOSPITAL_COMMUNITY)

## 2023-09-27 ENCOUNTER — Telehealth: Payer: Self-pay | Admitting: Gastroenterology

## 2023-09-27 ENCOUNTER — Other Ambulatory Visit: Payer: Self-pay | Admitting: Gastroenterology

## 2023-09-27 DIAGNOSIS — K219 Gastro-esophageal reflux disease without esophagitis: Secondary | ICD-10-CM

## 2023-09-27 MED ORDER — DEXLANSOPRAZOLE 60 MG PO CPDR: 60.0000 mg | DELAYED_RELEASE_CAPSULE | Freq: Every day | ORAL | 11 refills | Status: AC

## 2023-09-27 NOTE — Telephone Encounter (Signed)
 Rx sent.

## 2023-09-27 NOTE — Telephone Encounter (Signed)
 Patient called to say she is needing refills on the Dexilant .  She said she had left messages already.

## 2023-09-27 NOTE — Telephone Encounter (Signed)
 Pt needs refill on Dexilant . Pt last OV 06/06/2023

## 2023-09-27 NOTE — Telephone Encounter (Signed)
 Noted. Did a PA for medication

## 2023-10-05 ENCOUNTER — Ambulatory Visit (HOSPITAL_COMMUNITY)

## 2023-10-13 ENCOUNTER — Encounter (HOSPITAL_COMMUNITY): Payer: Self-pay

## 2023-10-13 ENCOUNTER — Ambulatory Visit (HOSPITAL_COMMUNITY)
Admission: RE | Admit: 2023-10-13 | Discharge: 2023-10-13 | Disposition: A | Discharge status: Home / Self Care | Source: Ambulatory Visit | Attending: Internal Medicine | Admitting: Internal Medicine

## 2023-10-13 DIAGNOSIS — Z1231 Encounter for screening mammogram for malignant neoplasm of breast: Secondary | ICD-10-CM | POA: Insufficient documentation

## 2023-11-10 ENCOUNTER — Encounter: Payer: Self-pay | Admitting: Gastroenterology

## 2023-11-14 ENCOUNTER — Other Ambulatory Visit: Payer: Self-pay | Admitting: Gastroenterology

## 2023-11-14 DIAGNOSIS — K649 Unspecified hemorrhoids: Secondary | ICD-10-CM

## 2023-11-17 ENCOUNTER — Telehealth: Payer: Self-pay | Admitting: Gastroenterology

## 2023-11-17 NOTE — Telephone Encounter (Signed)
 Patient called into the office to ask if she could get a refill on her Dexilant .

## 2023-11-18 NOTE — Telephone Encounter (Signed)
 Spoke to pt, informed her that Dexilant  has 11 refills. She voiced understanding. Informed to call the pharmacy.

## 2023-12-05 ENCOUNTER — Ambulatory Visit: Admitting: Gastroenterology

## 2023-12-12 ENCOUNTER — Ambulatory Visit: Admitting: Gastroenterology

## 2023-12-28 ENCOUNTER — Ambulatory Visit: Admitting: Gastroenterology

## 2023-12-28 ENCOUNTER — Encounter: Payer: Self-pay | Admitting: Gastroenterology

## 2023-12-28 VITALS — BP 119/76 | HR 97 | Temp 98.7°F | Ht 66.0 in | Wt 164.6 lb

## 2023-12-28 DIAGNOSIS — K580 Irritable bowel syndrome with diarrhea: Secondary | ICD-10-CM

## 2023-12-28 DIAGNOSIS — K589 Irritable bowel syndrome without diarrhea: Secondary | ICD-10-CM | POA: Insufficient documentation

## 2023-12-28 DIAGNOSIS — K219 Gastro-esophageal reflux disease without esophagitis: Secondary | ICD-10-CM | POA: Diagnosis not present

## 2023-12-28 NOTE — Patient Instructions (Signed)
 Continue Dexilant  60mg  daily before breakfast.   You can use imodium as needed for diarrhea, try to limit 1-2 tablets daily as needed.   If you find you are having more abdominal cramping, we can send in something to take place of imodium and would also help the cramping. It would be similar to the levsin  but would be on your insurance plan.  Return to the office in six months or sooner as needed.

## 2023-12-28 NOTE — Progress Notes (Signed)
 GI Office Note    Referring Provider: Vicci Othel Pro, MD Primary Care Physician:  Vicci Othel Pro, MD  Primary Gastroenterologist: Ozell Hollingshead, MD   Chief Complaint   Chief Complaint  Patient presents with   Follow-up    Doing well, no issues    History of Present Illness   Gabrielle Scott is a 48 y.o. female presenting today for follow up. Last seen 05/2023. 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   Discussed the use of AI scribe software for clinical note transcription with the patient, who gave verbal consent to proceed.   Her heartburn is well-controlled with Dexilant , taken once daily. She has no issues with appetite and experiences stomach pain only occasionally before a bowel movement. No dysphagia. No vomiting.  She experiences frequent bowel movements, sometimes requiring Imodium once a week to manage increased frequency. On days she uses Imodium, she typically has two stools and uses it to manage her bowel movements when she needs to go out. No blood in her stool.  She previously used hyoscyamine  for diarrhea but discontinued it due to cost. She finds Imodium effective in managing her symptoms without causing constipation. She had a colonoscopy six years ago and is due for another in four years unless symptoms warrant an earlier procedure.   Wt Readings from Last 3 Encounters:  12/28/23 164 lb 9.6 oz (74.7 kg)  06/06/23 155 lb 9.6 oz (70.6 kg)  04/21/23 156 lb 1.4 oz (70.8 kg)    Prior Data   EGD 06/2019: -normal esophagus -multiple gastric polyps. 2 largest ones removed, fundic gland polyp - Normal duodenal bulb and second portion of duodenum  Colonoscopy March 2019: - Entire examined colon normal - Internal  hemorrhoids  Medications   Current Outpatient Medications  Medication Sig Dispense Refill   amLODipine (NORVASC) 10 MG tablet Take 10 mg by mouth daily.     CALCIUM 600+D 600-10 MG-MCG TABS Take 1 tablet by mouth daily.     cetirizine (ZYRTEC) 10 MG tablet Take 10 mg by mouth daily.     cyclobenzaprine (FLEXERIL) 5 MG tablet 1 tablet Orally every 8 hours for 30 days     dexlansoprazole  (DEXILANT ) 60 MG capsule Take 1 capsule (60 mg total) by mouth daily. 30 capsule 11   hydrocortisone  (ANUSOL -HC) 2.5 % rectal cream PLACE RECTALLY TWICE DAILY 30 g 1   LORazepam (ATIVAN) 0.5 MG tablet Take 0.5 mg by mouth 2 (two) times daily as needed for anxiety.      losartan (COZAAR) 100 MG tablet Oral for 30 Days     medroxyPROGESTERone (DEPO-PROVERA) 150 MG/ML injection Inject 150 mg into the muscle every 3 (three) months.     metoprolol succinate (TOPROL-XL) 100 MG 24 hr tablet Take 100 mg by mouth daily. Take with or immediately following a meal.     QUEtiapine (SEROQUEL) 100 MG tablet Take 100 mg by mouth at bedtime.     RISPERDAL CONSTA 50 MG injection Inject 50 mg into the muscle every 14 (fourteen) days.     sertraline (ZOLOFT) 25 MG tablet Take 25 mg by mouth daily.     No current facility-administered medications for this visit.    Allergies   Allergies as of 12/28/2023   (No Known Allergies)  Review of Systems   General: Negative for anorexia, weight loss, fever, chills, fatigue, weakness. ENT: Negative for hoarseness, difficulty swallowing , nasal congestion. CV: Negative for chest pain, angina, palpitations, dyspnea on exertion, peripheral edema.  Respiratory: Negative for dyspnea at rest, dyspnea on exertion, cough, sputum, wheezing.  GI: See history of present illness. GU:  Negative for dysuria, hematuria, urinary incontinence, urinary frequency, nocturnal urination.  Endo: Negative for unusual weight change.     Physical Exam   BP 119/76 (BP Location: Right Arm, Patient  Position: Sitting, Cuff Size: Normal)   Pulse 97   Temp 98.7 F (37.1 C) (Oral)   Ht 5' 6 (1.676 m)   Wt 164 lb 9.6 oz (74.7 kg)   LMP  (LMP Unknown)   SpO2 98%   BMI 26.57 kg/m    General: Well-nourished, well-developed in no acute distress.  Eyes: No icterus. Mouth: Oropharyngeal mucosa moist and pink   Abdomen: Bowel sounds are normal, nontender, nondistended, no hepatosplenomegaly or masses,  no abdominal bruits or hernia , no rebound or guarding.  Rectal: not performed Extremities: No lower extremity edema. No clubbing or deformities. Neuro: Alert and oriented x 4   Skin: Warm and dry, no jaundice.   Psych: Alert and cooperative, normal mood and affect.  Labs   Lab Results  Component Value Date   NA 137 04/21/2023   CL 101 04/21/2023   K 3.6 04/21/2023   CO2 25 04/21/2023   BUN 9 04/21/2023   CREATININE 0.94 04/21/2023   GFRNONAA >60 04/21/2023   CALCIUM 9.5 04/21/2023   ALBUMIN 3.4 (L) 12/25/2022   GLUCOSE 89 04/21/2023   Lab Results  Component Value Date   WBC 5.0 04/21/2023   HGB 13.0 04/21/2023   HCT 41.4 04/21/2023   MCV 85.7 04/21/2023   PLT 206 04/21/2023   Lab Results  Component Value Date   ALT 11 12/25/2022   AST 12 (L) 12/25/2022   ALKPHOS 55 12/25/2022   BILITOT 0.4 12/25/2022    Imaging Studies   No results found.  Assessment/Plan:    Gastroesophageal reflux disease (GERD) without esophagitis GERD well-controlled with Dexilant . Occasional heartburn with trigger foods.  - Continue Dexilant  once daily. - reinforced antireflux measures -return ov in six months. Consider trial off PPI in the future although she has chronic symptoms which have been difficult to control with OTC antacids in the past.   IBS-D: Could not afford Levsin . Imodium doing well, using 1 tablet about once per week.  -continue imodium as needed, limit to avoid constipation     Sonny RAMAN. Ezzard, MHS, PA-C [facility]
